# Patient Record
Sex: Male | Born: 1970 | Race: White | Hispanic: No | Marital: Married | Smoking: Never smoker
Health system: Southern US, Community
[De-identification: ages and names within clinical notes are randomized; demographics above are authoritative.]

## PROBLEM LIST (undated history)

## (undated) DIAGNOSIS — E079 Disorder of thyroid, unspecified: Secondary | ICD-10-CM

## (undated) DIAGNOSIS — B353 Tinea pedis: Secondary | ICD-10-CM

## (undated) DIAGNOSIS — I10 Essential (primary) hypertension: Secondary | ICD-10-CM

## (undated) DIAGNOSIS — J40 Bronchitis, not specified as acute or chronic: Secondary | ICD-10-CM

## (undated) DIAGNOSIS — E119 Type 2 diabetes mellitus without complications: Secondary | ICD-10-CM

## (undated) HISTORY — DX: Bronchitis, not specified as acute or chronic: J40

## (undated) HISTORY — DX: Tinea pedis: B35.3

## (undated) HISTORY — PX: CHOLECYSTECTOMY: SHX55

## (undated) HISTORY — DX: Type 2 diabetes mellitus without complications: E11.9

## (undated) HISTORY — PX: KIDNEY STONE SURGERY: SHX686

---

## 2011-02-11 ENCOUNTER — Inpatient Hospital Stay: Payer: Self-pay | Admitting: Internal Medicine

## 2011-02-11 ENCOUNTER — Ambulatory Visit: Payer: Self-pay | Admitting: Internal Medicine

## 2011-02-21 ENCOUNTER — Ambulatory Visit: Payer: Self-pay | Admitting: Surgery

## 2011-02-28 ENCOUNTER — Ambulatory Visit: Payer: Self-pay | Admitting: Surgery

## 2011-03-04 LAB — PATHOLOGY REPORT

## 2012-12-31 DIAGNOSIS — I10 Essential (primary) hypertension: Secondary | ICD-10-CM | POA: Insufficient documentation

## 2012-12-31 DIAGNOSIS — E669 Obesity, unspecified: Secondary | ICD-10-CM | POA: Insufficient documentation

## 2013-02-27 IMAGING — CT CT ABDOMEN AND PELVIS WITHOUT AND WITH CONTRAST
1 of 3 series · 12 of 32 positions shown, 18 images · IV contrast (isovue)
Comparison: None

REASON FOR EXAM: Rt Back Flank Pain Hematuria Elevated LFT
COMMENTS:

PROCEDURE:     SALMANOV - SALMANOV ABDOMEN / PELVIS W/WO  - February 11, 2011  [DATE]
RESULT:     History: Right flank pain
TECHNIQUE: Multiple axial images of the abdomen and pelvis were performed
from the lung bases to the pubic symphysis, with p.o. contrast and prior to
and following 100 ml of Isovue 370 intravenous contrast.

[Series 2: soft tissue · axial · 0.84mm/px · z∈[-94,+316]mm · 12 of 98 slices shown, 18 images]
[im 8/98  soft-tissue]
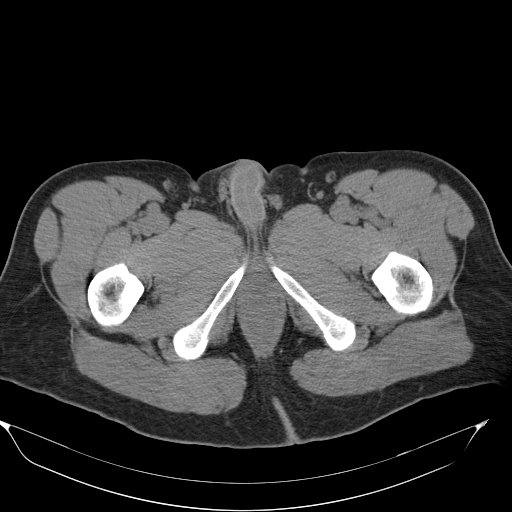
[im 8/98  bone]
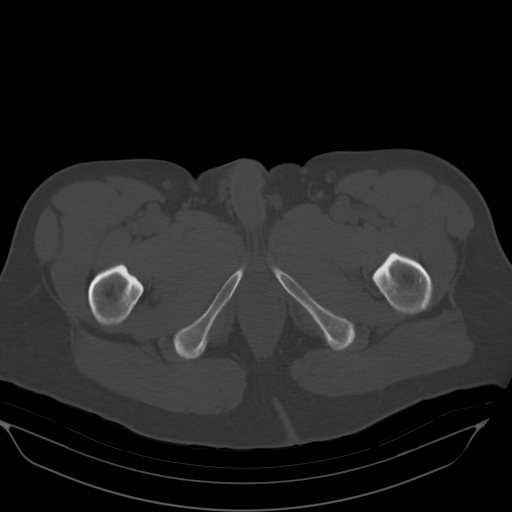
[im 15/98  soft-tissue]
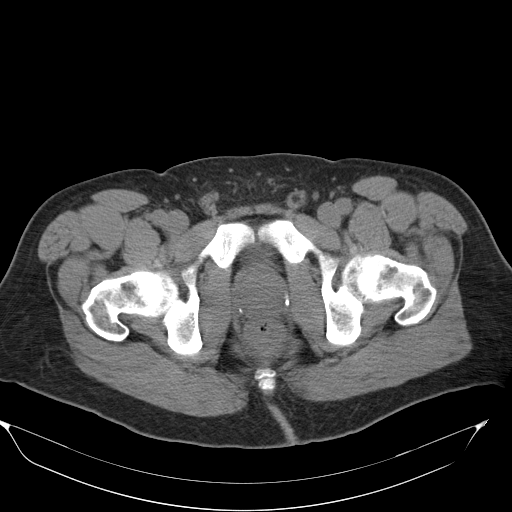
[im 23/98  soft-tissue]
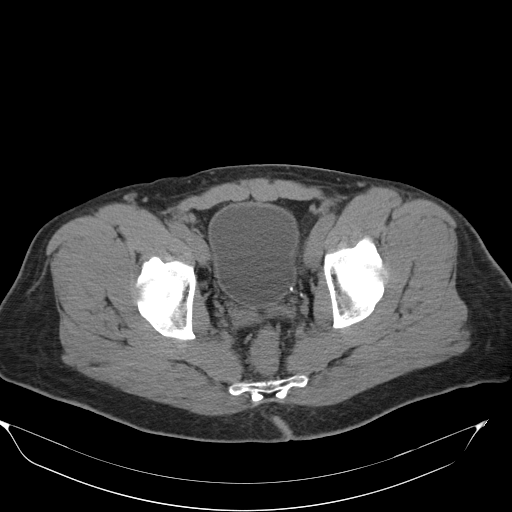
[im 30/98  soft-tissue]
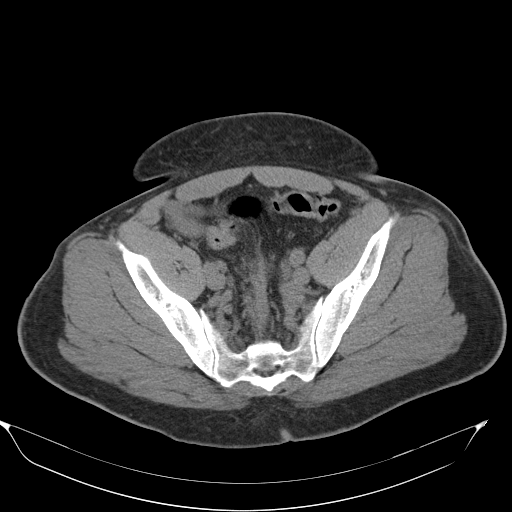
[im 38/98  soft-tissue]
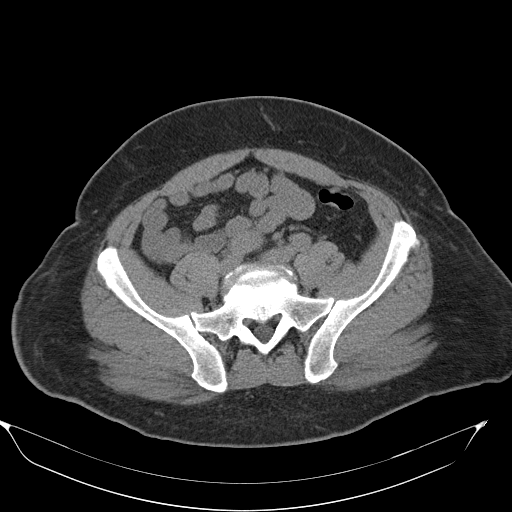
[im 45/98  soft-tissue]
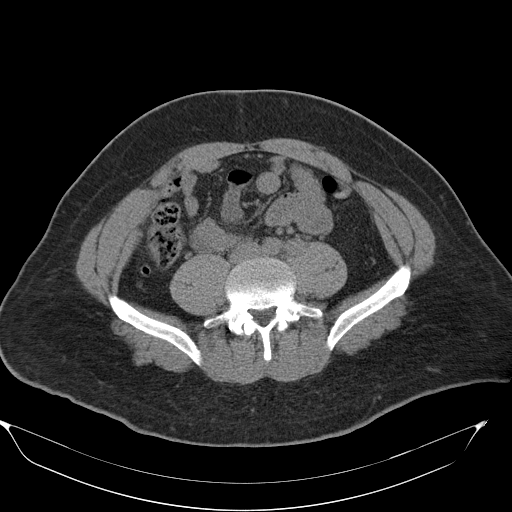
[im 53/98  soft-tissue]
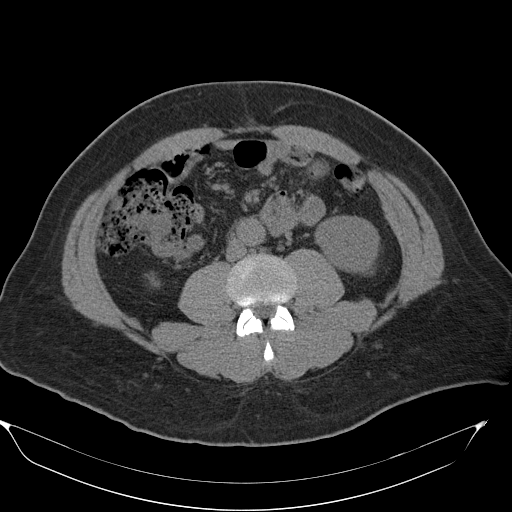
[im 60/98  soft-tissue]
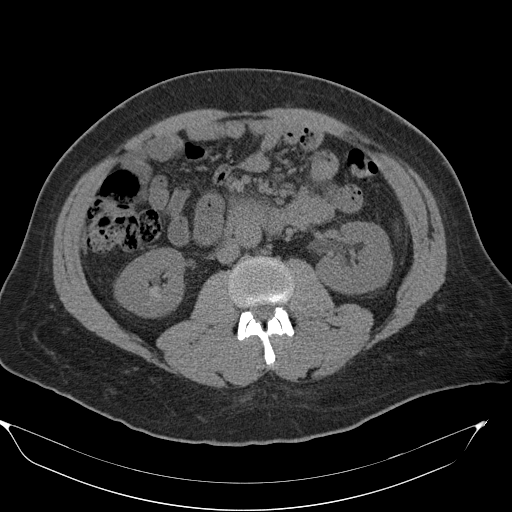
[im 68/98  soft-tissue]
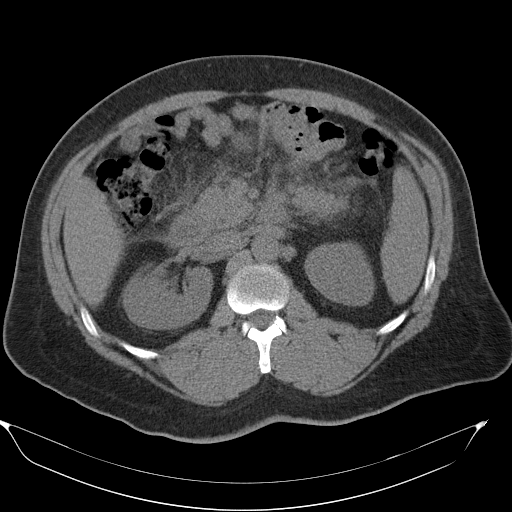
[im 68/98  lung]
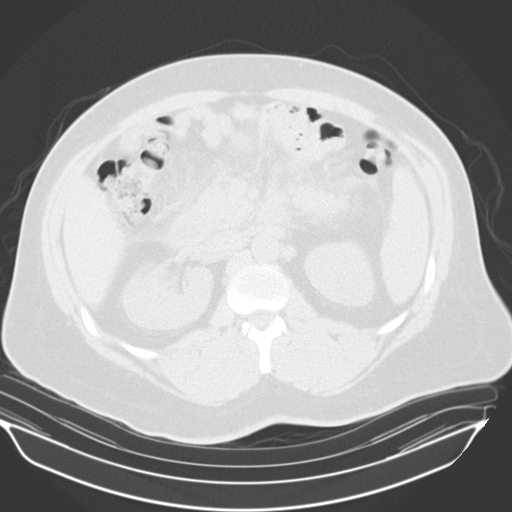
[im 68/98  bone]
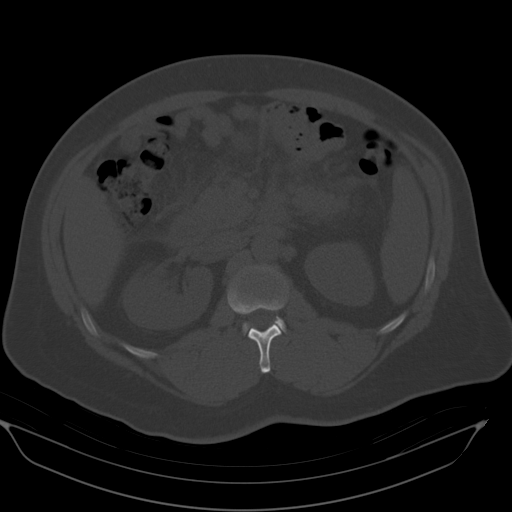
[im 75/98  soft-tissue]
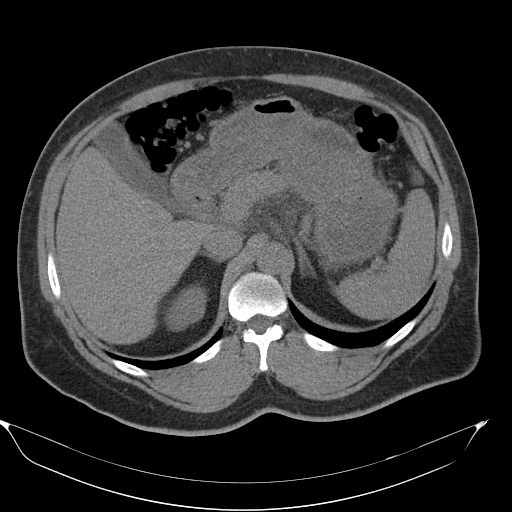
[im 75/98  lung]
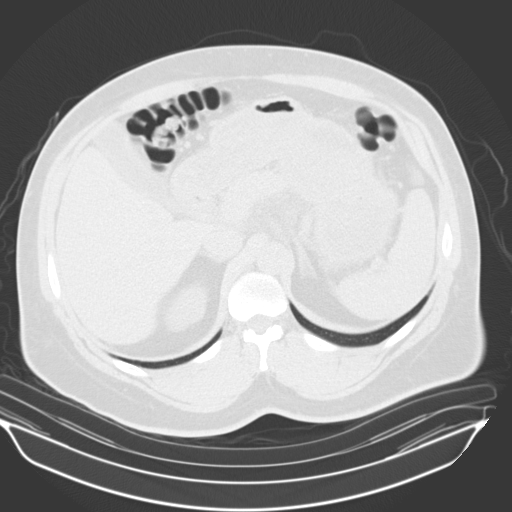
[im 83/98  soft-tissue]
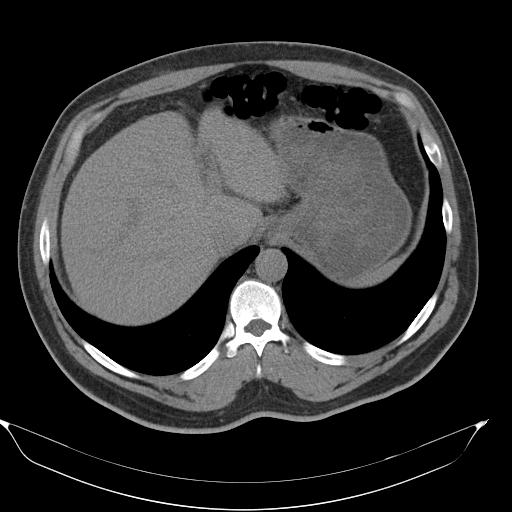
[im 83/98  lung]
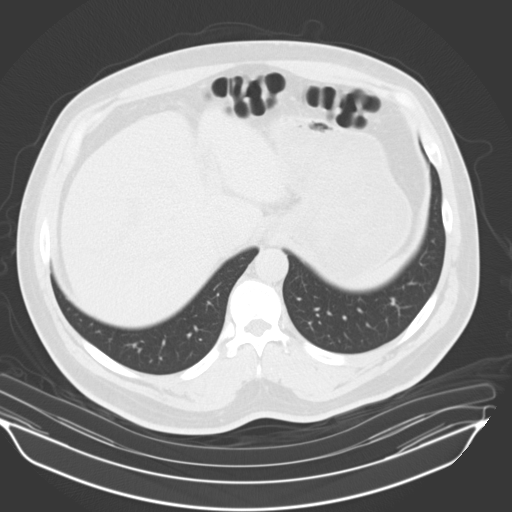
[im 90/98  soft-tissue]
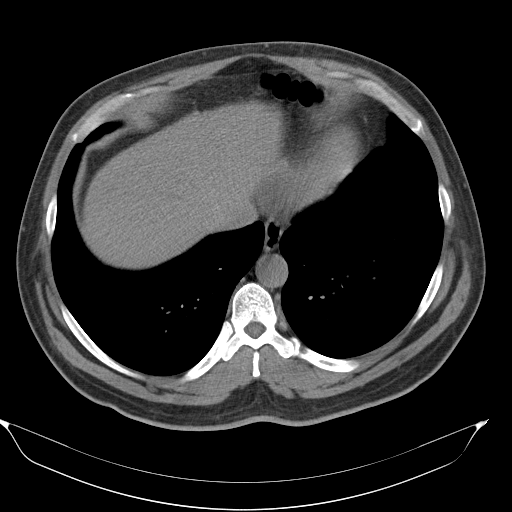
[im 90/98  lung]
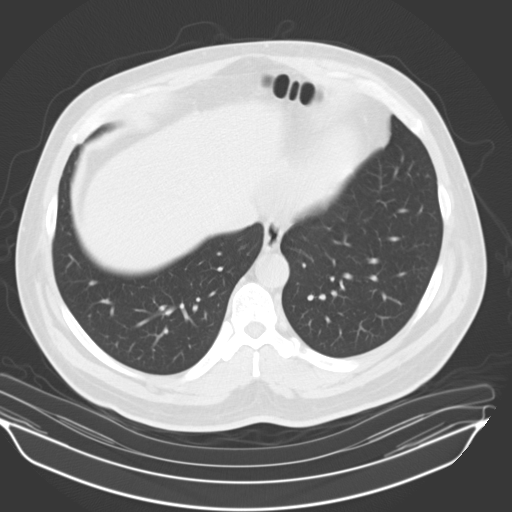

[12 of 32 positions shown; findings below may reference images not displayed]

FINDINGS: The lung bases are clear. There is no pneumothorax. The heart size is
normal.

The liver demonstrates no focal abnormality. There is no intrahepatic or
extrahepatic biliary ductal dilatation. The gallbladder is unremarkable. The
spleen demonstrates no focal abnormality. The kidneys and adrenal glands are
unremarkable. There is peripancreatic haziness around the pancreatic head
and neck as can be seen with pancreatitis. The bladder is unremarkable.

The stomach, duodenum, small intestine, and large intestine demonstrate no
contrast extravasation or dilatation. There is a normal caliber appendix in
the right lower quadrant without periappendiceal inflammatory changes. There
is no pneumoperitoneum, pneumatosis, or portal venous gas. There is no
abdominal or pelvic free fluid. There is no lymphadenopathy.

The abdominal aorta is normal in caliber .

The osseous structures are unremarkable.
IMPRESSION: 1. There is peripancreatic haziness around the pancreatic head and neck as
can be seen with pancreatitis. Early with lipase levels.

2. Normal appendix.

3. No urolithiasis.

## 2014-06-05 DIAGNOSIS — S60229A Contusion of unspecified hand, initial encounter: Secondary | ICD-10-CM | POA: Insufficient documentation

## 2015-08-22 DIAGNOSIS — E291 Testicular hypofunction: Secondary | ICD-10-CM | POA: Insufficient documentation

## 2015-08-22 DIAGNOSIS — E349 Endocrine disorder, unspecified: Secondary | ICD-10-CM | POA: Insufficient documentation

## 2015-09-14 DIAGNOSIS — E291 Testicular hypofunction: Secondary | ICD-10-CM | POA: Insufficient documentation

## 2015-12-20 ENCOUNTER — Ambulatory Visit
Admission: EM | Admit: 2015-12-20 | Discharge: 2015-12-20 | Disposition: A | Discharge status: Home / Self Care | Payer: Managed Care, Other (non HMO) | Attending: Family Medicine | Admitting: Family Medicine

## 2015-12-20 DIAGNOSIS — L03818 Cellulitis of other sites: Secondary | ICD-10-CM

## 2015-12-20 DIAGNOSIS — B49 Unspecified mycosis: Secondary | ICD-10-CM

## 2015-12-20 HISTORY — DX: Disorder of thyroid, unspecified: E07.9

## 2015-12-20 HISTORY — DX: Essential (primary) hypertension: I10

## 2015-12-20 MED ORDER — FLUCONAZOLE 150 MG PO TABS: 150.0000 mg | ORAL_TABLET | ORAL | Status: AC

## 2015-12-20 MED ORDER — SULFAMETHOXAZOLE-TRIMETHOPRIM 800-160 MG PO TABS: 1.0000 | ORAL_TABLET | Freq: Two times a day (BID) | ORAL | Status: AC

## 2015-12-20 NOTE — ED Notes (Signed)
Non-toxic appearing pt with reports of recurring "fungus" to toe.  Is being treated for "athletes foot" to abdominal fold.

## 2015-12-20 NOTE — ED Provider Notes (Signed)
CSN: [id number]     Arrival date & time 12/20/15  1437 History   First MD Initiated Contact with Patient 12/20/15 1553     Chief Complaint  Patient presents with  . Cellulitis  . Nail Problem  . Recurrent Skin Infections   (Consider location/radiation/quality/duration/timing/severity/associated sxs/prior Treatment) HPI: Patient presents today with symptoms of ongoing tinea pedis. Patient states that he has been treated with topical medicines in the past and oral medicines. He has noticed recently that there has been a oozing red site from the left webspace between the third and fourth and fourth and fifth toes. He also states that he has had a fungal infection in the pannus area and jock itch area that has improved some with the Diflucan that was prescribed for him. He was only given 2 doses of this to be spread out over 3 days. He denies any fever. He is seeing an endocrinologist tomorrow and we will check for any conditions such as diabetes. He has checked his blood sugar in the recent past and was normal according to patient.  Past Medical History  Diagnosis Date  . Thyroid disease   . Hypertension    Past Surgical History  Procedure Laterality Date  . Cholecystectomy     History reviewed. No pertinent family history. Social History  Substance Use Topics  . Smoking status: Never Smoker   . Smokeless tobacco: None  . Alcohol Use: No    Review of Systems: Negative except mentioned.   Allergies  Review of patient's allergies indicates no known allergies.  Home Medications   Prior to Admission medications   Medication Sig Start Date End Date Taking? Authorizing Provider  lisinopril (PRINIVIL,ZESTRIL) 20 MG tablet Take 20 mg by mouth daily.   Yes Historical Provider, MD  testosterone cypionate (DEPOTESTOSTERONE CYPIONATE) 200 MG/ML injection Inject 0.75 mg into the muscle every 7 (seven) days.   Yes Historical Provider, MD  fluconazole (DIFLUCAN) 150 MG tablet Take 1 tablet (150  mg total) by mouth once a week. 12/20/15 01/10/16  Jolene Provost, MD  sulfamethoxazole-trimethoprim (BACTRIM DS,SEPTRA DS) 800-160 MG tablet Take 1 tablet by mouth 2 (two) times daily. 12/20/15 12/27/15  Jolene Provost, MD   Meds Ordered and Administered this Visit  Medications - No data to display  BP 150/100 mmHg  Pulse 85  Temp(Src) 98 F (36.7 C) (Oral)  Resp 16  SpO2 95% No data found.   Physical Exam:  GENERAL: NAD, obese  RESP: CTA B CARD: RRR SKIN: mild erythema of pannus area, no breakdown of skin or drainage, *didn't show genital area but states it is cleared EXTREM: L Foot: mild erythema between the third and fourth toes and fourth and fifth toes, there is a small draining area between the fourth and fifth toes, there is no erythema extending into the dorsum of the foot at this time or on the plantar surface, mild onychomycoses  NEURO: CN II-XII grossly intact   ED Course  Procedures (including critical care time)  Labs Review Labs Reviewed - No data to display  Imaging Review No results found.    MDM   1. Cellulitis of other specified site   2. Fungal infection    Will treat with oral Diflucan weekly for 4 weeks given large surface area, Bactrim DS given forcellulitic process developing on the foot, encourage patient to keep the area dry and clean and to monitor site daily. He will be checking with the endocrinologist tomorrow to review any risk for diabetes.  I've asked that he follow up with podiatry as well. Seek medical attention if symptoms persist or worsen as discussed.    Jolene Provost, MD 12/20/15 [phone]

## 2015-12-20 NOTE — ED Notes (Signed)
Reporting toe infection.  Swelling to toe.   Has fungus/yeast infection to panis and foot.  Has been putting nyastatin on area, has some left, but is running out.

## 2016-02-08 ENCOUNTER — Encounter: Payer: Self-pay | Admitting: Family Medicine

## 2016-02-08 ENCOUNTER — Ambulatory Visit (INDEPENDENT_AMBULATORY_CARE_PROVIDER_SITE_OTHER): Payer: Managed Care, Other (non HMO) | Admitting: Family Medicine

## 2016-02-08 VITALS — BP 148/102 | HR 76 | Temp 98.0°F | Resp 16 | Ht 70.5 in | Wt 368.0 lb

## 2016-02-08 DIAGNOSIS — E349 Endocrine disorder, unspecified: Secondary | ICD-10-CM

## 2016-02-08 DIAGNOSIS — R7303 Prediabetes: Secondary | ICD-10-CM | POA: Insufficient documentation

## 2016-02-08 DIAGNOSIS — I10 Essential (primary) hypertension: Secondary | ICD-10-CM

## 2016-02-08 DIAGNOSIS — E291 Testicular hypofunction: Secondary | ICD-10-CM

## 2016-02-08 DIAGNOSIS — Z87442 Personal history of urinary calculi: Secondary | ICD-10-CM | POA: Insufficient documentation

## 2016-02-08 MED ORDER — METFORMIN HCL 500 MG PO TABS
500.0000 mg | ORAL_TABLET | Freq: Two times a day (BID) | ORAL | Status: DC
Start: 2016-02-08 — End: 2016-03-05

## 2016-02-08 MED ORDER — HYDROCHLOROTHIAZIDE 25 MG PO TABS: 25.0000 mg | ORAL_TABLET | Freq: Every day | ORAL | Status: DC

## 2016-02-08 NOTE — Progress Notes (Signed)
Date:  02/08/2016   Name:  Wesley Hall   DOB:  09-Jul-1971   MRN:  [id number]  PCP:  Rolm Gala, MD    Chief Complaint: Establish Care   History of Present Illness:  This is a 45 y.o. male to establish care. On testosterone injections through [facility] endo x 5 months but having multiple side effects including fluid retention/edema, acne, sweating. Does not feel these side effects are being adequately addressed and thinks maybe should be on antiestrogen and HGH. Not interested in stopping testosterone but willing to see another endo. Last test level high and endo wanted to lower dose. HTN well controlled on lisinopril before injections but poorly controlled since. BMI 52, injections have not given energy to exercise but is lifting weights again. Hx recurrent fungal infections of feet and groin. Prediabetic in past, a1c 5.6% in October. PSA and TSH normal in October. Thinks needs sleep study due to snoring but thinks can arrange without copay (works in sleep lab). Mother bipolar, only child. Last tet imm 2008.  Review of Systems:  Review of Systems  Constitutional: Negative for fever and fatigue.  Respiratory: Negative for cough and shortness of breath.   Cardiovascular: Negative for chest pain.  Gastrointestinal: Negative for abdominal pain.  Endocrine: Negative for polyuria.  Genitourinary: Negative for difficulty urinating.  Neurological: Negative for syncope and light-headedness.    Patient Active Problem List   Diagnosis Date Noted  . History of kidney stones 02/08/2016  . Obesity, morbid, BMI 50 or higher (HCC) 02/08/2016  . Prediabetes 02/08/2016  . Hypogonadism in male 08/22/2015  . BP (high blood pressure) 12/31/2012    Prior to Admission medications   Medication Sig Start Date End Date Taking? Authorizing Provider  B-D 3CC LUER-LOK SYR 18GX1-1/2 18G X 1-1/2" 3 ML MISC USE TO DRAW UP TESTOSTERONE FROM THE VIAL. 12/24/15  Yes Historical Provider, MD  BD DISP NEEDLES  22G X 1-1/2" MISC USE TO ADMINISTER IM TESTOSTERONE. 11/23/15  Yes Historical Provider, MD  lisinopril (PRINIVIL,ZESTRIL) 20 MG tablet Take 20 mg by mouth daily.   Yes Historical Provider, MD  nystatin cream (MYCOSTATIN) Apply topically. 11/19/15 11/18/16 Yes Historical Provider, MD  testosterone cypionate (DEPOTESTOSTERONE CYPIONATE) 200 MG/ML injection Inject 0.75 mg into the muscle every 7 (seven) days.   Yes Historical Provider, MD  triamcinolone cream (KENALOG) 0.1 % APPLY TOPICALLY 3 (THREE) TIMES DAILY. 11/19/15  Yes Historical Provider, MD  hydrochlorothiazide (HYDRODIURIL) 25 MG tablet Take 1 tablet (25 mg total) by mouth daily. 02/08/16   Schuyler Amor, MD  metFORMIN (GLUCOPHAGE) 500 MG tablet Take 1 tablet (500 mg total) by mouth 2 (two) times daily with a meal. 02/08/16   Schuyler Amor, MD    No Known Allergies  Past Surgical History  Procedure Laterality Date  . Cholecystectomy      Social History  Substance Use Topics  . Smoking status: Never Smoker   . Smokeless tobacco: None  . Alcohol Use: No    History reviewed. No pertinent family history.  Medication list has been reviewed and updated.  Physical Examination: BP 148/102 mmHg  Pulse 76  Temp(Src) 98 F (36.7 C) (Oral)  Resp 16  Ht 5' 10.5" (1.791 m)  Wt 368 lb (166.924 kg)  BMI 52.04 kg/m2  SpO2 96%  Physical Exam  Constitutional: He is oriented to person, place, and time. He appears well-developed and well-nourished.  HENT:  Head: Normocephalic and atraumatic.  Right Ear: External ear normal.  Left Ear: External ear  normal.  Nose: Nose normal.  Mouth/Throat: Oropharynx is clear and moist.  Eyes: Conjunctivae and EOM are normal. Pupils are equal, round, and reactive to light.  Neck: Neck supple. No thyromegaly present.  Cardiovascular: Normal rate, regular rhythm and normal heart sounds.   Pulmonary/Chest: Effort normal and breath sounds normal.  Abdominal: Soft. He exhibits no distension and no mass.   Genitourinary: Penis normal.  Testes small, no masses, no hernias noted  Musculoskeletal:  !+ BLE edema  Lymphadenopathy:    He has no cervical adenopathy.  Neurological: He is alert and oriented to person, place, and time. Coordination normal.  Skin: Skin is warm and dry.  Psychiatric: He has a normal mood and affect. His behavior is normal.  Nursing note and vitals reviewed.   Assessment and Plan:  1. Essential hypertension Poor control, add HCTZ 25 mg daily to lisinopril  2. Prediabetes Begin metformin 500 mg bid, may help with weight loss  3. Hypotestosteronism Cont testosterone replacement at current dose until sees new endo - Ambulatory referral to Endocrinology  4. Obesity, morbid, BMI 50 or higher (HCC) Discussed exercise/weight loss  Return in about 4 weeks (around 03/07/2016).  [name] Kingsley Hall, [name] [facility]  02/08/2016

## 2016-03-05 ENCOUNTER — Ambulatory Visit (INDEPENDENT_AMBULATORY_CARE_PROVIDER_SITE_OTHER): Payer: Managed Care, Other (non HMO) | Admitting: Family Medicine

## 2016-03-05 ENCOUNTER — Encounter: Payer: Self-pay | Admitting: Family Medicine

## 2016-03-05 VITALS — BP 110/78 | HR 96 | Resp 16 | Ht 71.0 in | Wt 360.0 lb

## 2016-03-05 DIAGNOSIS — I10 Essential (primary) hypertension: Secondary | ICD-10-CM

## 2016-03-05 DIAGNOSIS — E291 Testicular hypofunction: Secondary | ICD-10-CM | POA: Diagnosis not present

## 2016-03-05 DIAGNOSIS — R7303 Prediabetes: Secondary | ICD-10-CM | POA: Diagnosis not present

## 2016-03-05 DIAGNOSIS — S0031XA Abrasion of nose, initial encounter: Secondary | ICD-10-CM

## 2016-03-05 MED ORDER — METFORMIN HCL 500 MG PO TABS: 500.0000 mg | ORAL_TABLET | Freq: Two times a day (BID) | ORAL | Status: DC

## 2016-03-05 MED ORDER — HYDROCHLOROTHIAZIDE 25 MG PO TABS: 25.0000 mg | ORAL_TABLET | Freq: Every day | ORAL | Status: DC

## 2016-03-05 NOTE — Progress Notes (Signed)
Date:  03/05/2016   Name:  Wesley Hall   DOB:  [birth year]   MRN:  [id number]  PCP:  Rolm Gala, MD    Chief Complaint: Hypertension and Muscle Pain   History of Present Illness:  This is a 45 y.o. male seen in one month f/u. Tolerating HCTZ and metformin well. Weight down 8#. Saw [facility] endo, testosterone dose decreased with f/u in two months. C/o R nose lesion, used nasal spray recently.  Review of Systems:  Review of Systems  Constitutional: Negative for fever and fatigue.  Respiratory: Negative for cough and shortness of breath.   Endocrine: Negative for polyuria.  Genitourinary: Negative for difficulty urinating.  Neurological: Negative for syncope and light-headedness.    Patient Active Problem List   Diagnosis Date Noted  . History of kidney stones 02/08/2016  . Obesity, morbid, BMI 50 or higher (HCC) 02/08/2016  . Prediabetes 02/08/2016  . Hypogonadism in male 08/22/2015  . Hypertension 12/31/2012    Prior to Admission medications   Medication Sig Start Date End Date Taking? Authorizing Provider  albuterol (PROAIR HFA) 108 (90 Base) MCG/ACT inhaler  12/26/15  Yes Historical Provider, MD  B-D 3CC LUER-LOK SYR 18GX1-1/2 18G X 1-1/2" 3 ML MISC USE TO DRAW UP TESTOSTERONE FROM THE VIAL. 12/24/15  Yes Historical Provider, MD  BD DISP NEEDLES 22G X 1-1/2" MISC USE TO ADMINISTER IM TESTOSTERONE. 11/23/15  Yes Historical Provider, MD  hydrochlorothiazide (HYDRODIURIL) 25 MG tablet Take 1 tablet (25 mg total) by mouth daily. 03/05/16  Yes Schuyler Amor, MD  lisinopril (PRINIVIL,ZESTRIL) 20 MG tablet Take 20 mg by mouth daily.   Yes Historical Provider, MD  metFORMIN (GLUCOPHAGE) 500 MG tablet Take 1 tablet (500 mg total) by mouth 2 (two) times daily with a meal. 03/05/16  Yes Schuyler Amor, MD  testosterone cypionate (DEPOTESTOSTERONE CYPIONATE) 200 MG/ML injection Inject 0.75 mg into the muscle every 7 (seven) days.   Yes Historical Provider, MD  triamcinolone cream (KENALOG)  0.1 % APPLY TOPICALLY 3 (THREE) TIMES DAILY. 11/19/15  Yes Historical Provider, MD    No Known Allergies  Past Surgical History  Procedure Laterality Date  . Cholecystectomy      Social History  Substance Use Topics  . Smoking status: Never Smoker   . Smokeless tobacco: None  . Alcohol Use: No    History reviewed. No pertinent family history.  Medication list has been reviewed and updated.  Physical Examination: BP 110/78 mmHg  Pulse 96  Resp 16  Ht  (1.803 m)  Wt 360 lb (163.295 kg)  BMI 50.23 kg/m2  SpO2 96%  Physical Exam  Constitutional: He appears well-developed and well-nourished.  HENT:  Healing abrasion R nares  Cardiovascular: Normal rate, regular rhythm and normal heart sounds.   Pulmonary/Chest: Effort normal and breath sounds normal.  Musculoskeletal: He exhibits no edema.  Neurological: He is alert.  Skin: Skin is warm and dry.  Psychiatric: He has a normal mood and affect. His behavior is normal.  Nursing note and vitals reviewed.   Assessment and Plan:  1. Essential hypertension Well controlled on lisinopril/HCTZ, prefer to keep rxs separate, refill HCTZ  2. Prediabetes Refill metformin, consider a1c next visit (5.6% in October)  3. Obesity, morbid, BMI 50 or higher (HCC) Weight down 8# (metformin may be helping), encouraged continued weight loss  4. Hypogonadism in male On decreased testosterone dose, [facility] endo following  5. Abrasion of nose, initial encounter Expect spontaneous resolution  Return in about 3 months (  around 06/04/2016).  [name] [name], [name] [facility]  03/05/2016

## 2016-03-24 ENCOUNTER — Ambulatory Visit: Payer: Self-pay | Admitting: Unknown Physician Specialty

## 2016-04-03 ENCOUNTER — Other Ambulatory Visit: Payer: Self-pay | Admitting: Family Medicine

## 2016-04-03 MED ORDER — HYDROCHLOROTHIAZIDE 25 MG PO TABS: 25.0000 mg | ORAL_TABLET | Freq: Every day | ORAL | Status: DC

## 2016-04-04 ENCOUNTER — Other Ambulatory Visit: Payer: Self-pay | Admitting: Family Medicine

## 2016-04-04 MED ORDER — METFORMIN HCL 500 MG PO TABS: 500.0000 mg | ORAL_TABLET | Freq: Two times a day (BID) | ORAL | Status: DC

## 2016-04-20 ENCOUNTER — Ambulatory Visit
Admission: EM | Admit: 2016-04-20 | Discharge: 2016-04-20 | Disposition: A | Discharge status: Home / Self Care | Payer: Managed Care, Other (non HMO) | Attending: Family Medicine | Admitting: Family Medicine

## 2016-04-20 DIAGNOSIS — S0085XA Superficial foreign body of other part of head, initial encounter: Secondary | ICD-10-CM

## 2016-04-20 MED ORDER — TETANUS-DIPHTH-ACELL PERTUSSIS 5-2.5-18.5 LF-MCG/0.5 IM SUSP
0.5000 mL | Freq: Once | INTRAMUSCULAR | Status: AC
  Administered 2016-04-20: 0.5 mL via INTRAMUSCULAR

## 2016-04-20 MED ORDER — NYSTATIN 100000 UNIT/GM EX POWD: Freq: Three times a day (TID) | CUTANEOUS | Status: AC

## 2016-04-20 MED ORDER — LIDOCAINE HCL (PF) 1 % IJ SOLN: 5.0000 mL | Freq: Once | INTRAMUSCULAR | Status: DC

## 2016-04-20 MED ORDER — CEPHALEXIN 500 MG PO CAPS: 500.0000 mg | ORAL_CAPSULE | Freq: Four times a day (QID) | ORAL | Status: DC

## 2016-04-20 NOTE — Discharge Instructions (Signed)
Take medication as prescribed. Rest. Drink plenty of fluids. Keep area clean and dry.   Return to Urgent care or PCP in 7 days for suture removal as discussed.   Follow up with your primary care physician this week as needed. Return to Urgent care for new or worsening concerns.
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
## 2016-06-04 ENCOUNTER — Ambulatory Visit: Payer: Managed Care, Other (non HMO) | Admitting: Family Medicine

## 2016-06-04 ENCOUNTER — Encounter: Payer: Self-pay | Admitting: Family Medicine

## 2016-06-04 ENCOUNTER — Ambulatory Visit (INDEPENDENT_AMBULATORY_CARE_PROVIDER_SITE_OTHER): Payer: Managed Care, Other (non HMO) | Admitting: Family Medicine

## 2016-06-04 ENCOUNTER — Encounter (INDEPENDENT_AMBULATORY_CARE_PROVIDER_SITE_OTHER): Payer: Self-pay

## 2016-06-04 VITALS — BP 130/80 | HR 78 | Ht 71.0 in | Wt 346.0 lb

## 2016-06-04 DIAGNOSIS — E8881 Metabolic syndrome: Secondary | ICD-10-CM | POA: Diagnosis not present

## 2016-06-04 DIAGNOSIS — E781 Pure hyperglyceridemia: Secondary | ICD-10-CM | POA: Diagnosis not present

## 2016-06-04 DIAGNOSIS — B354 Tinea corporis: Secondary | ICD-10-CM | POA: Diagnosis not present

## 2016-06-04 DIAGNOSIS — I10 Essential (primary) hypertension: Secondary | ICD-10-CM | POA: Diagnosis not present

## 2016-06-04 DIAGNOSIS — Z7189 Other specified counseling: Secondary | ICD-10-CM

## 2016-06-04 DIAGNOSIS — Z7689 Persons encountering health services in other specified circumstances: Secondary | ICD-10-CM

## 2016-06-04 MED ORDER — METFORMIN HCL 500 MG PO TABS: 500.0000 mg | ORAL_TABLET | Freq: Two times a day (BID) | ORAL | 1 refills | Status: DC

## 2016-06-04 MED ORDER — HYDROCHLOROTHIAZIDE 25 MG PO TABS: 25.0000 mg | ORAL_TABLET | Freq: Every day | ORAL | 1 refills | Status: DC

## 2016-06-04 MED ORDER — LISINOPRIL 20 MG PO TABS: 20.0000 mg | ORAL_TABLET | Freq: Every day | ORAL | 1 refills | Status: DC

## 2016-06-04 MED ORDER — CLOTRIMAZOLE-BETAMETHASONE 1-0.05 % EX CREA: 1.0000 "application " | TOPICAL_CREAM | Freq: Two times a day (BID) | CUTANEOUS | 2 refills | Status: DC

## 2016-06-04 NOTE — Progress Notes (Signed)
Name: Wesley Hall   MRN: [id number]    DOB: 12/09/1970   Date:06/04/2016       Progress Note  Subjective  Chief Complaint  Chief Complaint  Patient presents with  . Advice Only    Diabetes  He presents for his follow-up diabetic visit. Diabetes type: prediabetic/metabolic syndrome. His disease course has been stable. There are no hypoglycemic associated symptoms. Associated symptoms include fatigue and weight loss. Pertinent negatives for diabetes include no blurred vision, no chest pain, no foot paresthesias, no foot ulcerations, no polydipsia, no polyphagia, no polyuria, no visual change and no weakness. (By design) Symptoms are stable. There are no diabetic complications. Risk factors for coronary artery disease include obesity, male sex and hypertension. Current diabetic treatment includes oral agent (monotherapy) (metfomen). He is compliant with treatment all of the time. His weight is stable. He is following a generally healthy diet. He participates in exercise daily.    No problem-specific Assessment & Plan notes found for this encounter.   Past Medical History:  Diagnosis Date  . Athlete's foot   . Bronchitis   . Hypertension   . Thyroid disease     Past Surgical History:  Procedure Laterality Date  . CHOLECYSTECTOMY      No family history on file.  Social History   Social History  . Marital status: Married    Spouse name: N/A  . Number of children: N/A  . Years of education: N/A   Occupational History  . Not on file.   Social History Main Topics  . Smoking status: Never Smoker  . Smokeless tobacco: Never Used  . Alcohol use No  . Drug use: No  . Sexual activity: Yes   Other Topics Concern  . Not on file   Social History Narrative  . No narrative on file    No Known Allergies   Review of Systems  Constitutional: Positive for fatigue and weight loss.  Eyes: Negative for blurred vision.  Cardiovascular: Negative for chest pain.  Neurological:  Negative for weakness.  Endo/Heme/Allergies: Negative for polydipsia and polyphagia.     Objective  Vitals:   06/04/16 0858  BP: 130/80  Pulse: 78  Weight: (!) 346 lb (156.9 kg)  Height: 5\' 11"  (1.803 m)    Physical Exam  Constitutional: He is oriented to person, place, and time and well-developed, well-nourished, and in no distress.  HENT:  Head: Normocephalic.  Right Ear: External ear normal.  Left Ear: External ear normal.  Nose: Nose normal.  Mouth/Throat: Oropharynx is clear and moist.  Eyes: Conjunctivae and EOM are normal. Pupils are equal, round, and reactive to light. Right eye exhibits no discharge. Left eye exhibits no discharge. No scleral icterus.  Neck: Normal range of motion. Neck supple. No JVD present. No tracheal deviation present. No thyromegaly present.  Cardiovascular: Normal rate, regular rhythm, normal heart sounds and intact distal pulses.  Exam reveals no gallop and no friction rub.   No murmur heard. Pulmonary/Chest: Breath sounds normal. No respiratory distress. He has no wheezes. He has no rales.  Abdominal: Soft. Bowel sounds are normal. He exhibits no mass. There is no hepatosplenomegaly. There is no tenderness. There is no rebound, no guarding and no CVA tenderness.  Musculoskeletal: Normal range of motion. He exhibits no edema or tenderness.  Lymphadenopathy:    He has no cervical adenopathy.  Neurological: He is alert and oriented to person, place, and time. He has normal sensation, normal strength and intact cranial nerves. No  cranial nerve deficit.  Skin: Skin is warm. No rash noted.  Psychiatric: Mood and affect normal.  Nursing note and vitals reviewed.     Assessment & Plan  Problem List Items Addressed This Visit      Cardiovascular and Mediastinum   Hypertension - Primary   Relevant Medications   hydrochlorothiazide (HYDRODIURIL) 25 MG tablet   lisinopril (PRINIVIL,ZESTRIL) 20 MG tablet     Other   Obesity, morbid, BMI 50 or  higher (HCC)   Relevant Medications   metFORMIN (GLUCOPHAGE) 500 MG tablet    Other Visit Diagnoses    Hypertriglyceridemia       Relevant Medications   hydrochlorothiazide (HYDRODIURIL) 25 MG tablet   lisinopril (PRINIVIL,ZESTRIL) 20 MG tablet   Metabolic syndrome       Relevant Medications   metFORMIN (GLUCOPHAGE) 500 MG tablet   Tinea corporis       Relevant Medications   clotrimazole-betamethasone (LOTRISONE) cream   Encounter to establish care            Dr. Hayden [facility]  06/04/16

## 2016-12-05 ENCOUNTER — Ambulatory Visit (INDEPENDENT_AMBULATORY_CARE_PROVIDER_SITE_OTHER): Payer: Managed Care, Other (non HMO) | Admitting: Family Medicine

## 2016-12-05 ENCOUNTER — Encounter: Payer: Self-pay | Admitting: Family Medicine

## 2016-12-05 VITALS — BP 120/70 | HR 72 | Ht 71.0 in | Wt 320.0 lb

## 2016-12-05 DIAGNOSIS — I10 Essential (primary) hypertension: Secondary | ICD-10-CM | POA: Diagnosis not present

## 2016-12-05 DIAGNOSIS — B354 Tinea corporis: Secondary | ICD-10-CM

## 2016-12-05 DIAGNOSIS — E663 Overweight: Secondary | ICD-10-CM

## 2016-12-05 DIAGNOSIS — B372 Candidiasis of skin and nail: Secondary | ICD-10-CM | POA: Diagnosis not present

## 2016-12-05 MED ORDER — HYDROCHLOROTHIAZIDE 25 MG PO TABS: 25.0000 mg | ORAL_TABLET | Freq: Every day | ORAL | 2 refills | Status: DC

## 2016-12-05 MED ORDER — CLOTRIMAZOLE-BETAMETHASONE 1-0.05 % EX CREA: 1.0000 "application " | TOPICAL_CREAM | Freq: Two times a day (BID) | CUTANEOUS | 2 refills | Status: DC

## 2016-12-05 MED ORDER — LISINOPRIL 20 MG PO TABS: 20.0000 mg | ORAL_TABLET | Freq: Every day | ORAL | 2 refills | Status: DC

## 2016-12-05 NOTE — Progress Notes (Signed)
Name: Wesley Hall   MRN: [id number]    DOB: 1971-02-10   Date:12/05/2016       Progress Note  Subjective  Chief Complaint  Chief Complaint  Patient presents with  . Hypertension  . Rash    uses Lotrisome cream for treatment    Hypertension  This is a chronic problem. The current episode started more than 1 year ago. The problem has been gradually improving since onset. The problem is controlled. Pertinent negatives include no anxiety, blurred vision, chest pain, headaches, malaise/fatigue, neck pain, orthopnea, palpitations, peripheral edema, PND, shortness of breath or sweats. There are no associated agents to hypertension. There are no known risk factors for coronary artery disease. Past treatments include ACE inhibitors and diuretics. There are no compliance problems.  There is no history of angina, kidney disease, CAD/MI, CVA, heart failure, left ventricular hypertrophy, PVD, renovascular disease or retinopathy. There is no history of chronic renal disease or a hypertension causing med.  Rash  This is a chronic problem. The current episode started more than 1 year ago. The problem has been waxing and waning since onset. The affected locations include the left lower leg and right lower leg. Pertinent negatives include no anorexia, congestion, cough, diarrhea, eye pain, facial edema, fatigue, fever, joint pain, nail changes, rhinorrhea, shortness of breath, sore throat or vomiting. Past treatments include nothing. The treatment provided mild relief.    No problem-specific Assessment & Plan notes found for this encounter.   Past Medical History:  Diagnosis Date  . Athlete's foot   . Bronchitis   . Diabetes mellitus without complication (HCC)   . Hypertension   . Thyroid disease     Past Surgical History:  Procedure Laterality Date  . CHOLECYSTECTOMY      History reviewed. No pertinent family history.  Social History   Social History  . Marital status: Married    Spouse  name: N/A  . Number of children: N/A  . Years of education: N/A   Occupational History  . Not on file.   Social History Main Topics  . Smoking status: Never Smoker  . Smokeless tobacco: Never Used  . Alcohol use No  . Drug use: No  . Sexual activity: Yes   Other Topics Concern  . Not on file   Social History Narrative  . No narrative on file    No Known Allergies   Review of Systems  Constitutional: Negative for chills, fatigue, fever, malaise/fatigue and weight loss.  HENT: Negative for congestion, ear discharge, ear pain, rhinorrhea and sore throat.   Eyes: Negative for blurred vision and pain.  Respiratory: Negative for cough, sputum production, shortness of breath and wheezing.   Cardiovascular: Negative for chest pain, palpitations, orthopnea, leg swelling and PND.  Gastrointestinal: Negative for abdominal pain, anorexia, blood in stool, constipation, diarrhea, heartburn, melena, nausea and vomiting.  Genitourinary: Negative for dysuria, flank pain, frequency, hematuria and urgency.       Nocturia/no hesitancy nor weak stream  Musculoskeletal: Negative for back pain, joint pain, myalgias and neck pain.  Skin: Positive for rash. Negative for nail changes.  Neurological: Positive for dizziness. Negative for tingling, sensory change, focal weakness and headaches.  Endo/Heme/Allergies: Negative for environmental allergies and polydipsia. Does not bruise/bleed easily.  Psychiatric/Behavioral: Negative for depression and suicidal ideas. The patient is not nervous/anxious and does not have insomnia.      Objective  Vitals:   12/05/16 1346  BP: 120/70  Pulse: 72  Weight: (!) 320  lb (145.2 kg)  Height: 5\' 11"  (1.803 m)    Physical Exam  Constitutional: He is oriented to person, place, and time and well-developed, well-nourished, and in no distress.  HENT:  Head: Normocephalic.  Right Ear: External ear normal.  Left Ear: External ear normal.  Nose: Nose normal.   Mouth/Throat: Oropharynx is clear and moist.  Eyes: Conjunctivae and EOM are normal. Pupils are equal, round, and reactive to light. Right eye exhibits no discharge. Left eye exhibits no discharge. No scleral icterus.  Neck: Normal range of motion. Neck supple. No JVD present. No tracheal deviation present. No thyromegaly present.  Cardiovascular: Normal rate, regular rhythm, normal heart sounds and intact distal pulses.  Exam reveals no gallop and no friction rub.   No murmur heard. Pulmonary/Chest: Breath sounds normal. No respiratory distress. He has no wheezes. He has no rales.  Abdominal: Soft. Bowel sounds are normal. He exhibits no mass. There is no hepatosplenomegaly. There is no tenderness. There is no rebound, no guarding and no CVA tenderness.  Musculoskeletal: Normal range of motion. He exhibits no edema or tenderness.  Lymphadenopathy:    He has no cervical adenopathy.  Neurological: He is alert and oriented to person, place, and time. He has normal sensation, normal strength, normal reflexes and intact cranial nerves. No cranial nerve deficit.  Skin: Skin is warm. No rash noted.  Psychiatric: Mood and affect normal.  Nursing note and vitals reviewed.     Assessment & Plan  Problem List Items Addressed This Visit      Cardiovascular and Mediastinum   Hypertension - Primary   Relevant Medications   lisinopril (PRINIVIL,ZESTRIL) 20 MG tablet   hydrochlorothiazide (HYDRODIURIL) 25 MG tablet   Other Relevant Orders   Renal Function Panel    Other Visit Diagnoses    Candida infection of flexural skin       Relevant Medications   clotrimazole-betamethasone (LOTRISONE) cream   Tinea corporis       Relevant Medications   clotrimazole-betamethasone (LOTRISONE) cream   Overweight       Relevant Orders   Lipid Profile        Dr. Elizabeth Sauer [facility]  12/05/16

## 2016-12-06 LAB — RENAL FUNCTION PANEL
ALBUMIN: 4.6 g/dL (ref 3.5–5.5)
BUN/Creatinine Ratio: 17 (ref 9–20)
BUN: 19 mg/dL (ref 6–24)
CHLORIDE: 98 mmol/L (ref 96–106)
CO2: 22 mmol/L (ref 18–29)
Calcium: 9.6 mg/dL (ref 8.7–10.2)
Creatinine, Ser: 1.1 mg/dL (ref 0.76–1.27)
GFR calc non Af Amer: 80 mL/min/{1.73_m2} (ref >59)
GFR, EST AFRICAN AMERICAN: 92 mL/min/{1.73_m2} (ref >59)
GLUCOSE: 122 mg/dL — AB (ref 65–99)
PHOSPHORUS: 3.4 mg/dL (ref 2.5–4.5)
POTASSIUM: 4.3 mmol/L (ref 3.5–5.2)
Sodium: 138 mmol/L (ref 134–144)

## 2016-12-06 LAB — LIPID PANEL
CHOLESTEROL TOTAL: 158 mg/dL (ref 100–199)
Chol/HDL Ratio: 4.2 ratio units (ref 0.0–5.0)
HDL: 38 mg/dL — AB (ref >39)
LDL Calculated: 94 mg/dL (ref 0–99)
TRIGLYCERIDES: 128 mg/dL (ref 0–149)
VLDL CHOLESTEROL CAL: 26 mg/dL (ref 5–40)

## 2017-01-08 ENCOUNTER — Other Ambulatory Visit: Payer: Self-pay

## 2017-01-08 DIAGNOSIS — I10 Essential (primary) hypertension: Secondary | ICD-10-CM

## 2017-01-08 MED ORDER — LISINOPRIL 20 MG PO TABS: 20.0000 mg | ORAL_TABLET | Freq: Every day | ORAL | 1 refills | Status: DC

## 2017-06-19 ENCOUNTER — Other Ambulatory Visit: Payer: Self-pay | Admitting: Family Medicine

## 2017-06-19 DIAGNOSIS — E8881 Metabolic syndrome: Secondary | ICD-10-CM

## 2017-06-21 ENCOUNTER — Other Ambulatory Visit: Payer: Self-pay | Admitting: Family Medicine

## 2017-06-21 DIAGNOSIS — I10 Essential (primary) hypertension: Secondary | ICD-10-CM

## 2017-07-19 ENCOUNTER — Other Ambulatory Visit: Payer: Self-pay | Admitting: Family Medicine

## 2017-07-19 DIAGNOSIS — I10 Essential (primary) hypertension: Secondary | ICD-10-CM

## 2017-07-19 DIAGNOSIS — E8881 Metabolic syndrome: Secondary | ICD-10-CM

## 2017-07-27 ENCOUNTER — Other Ambulatory Visit: Payer: Self-pay | Admitting: Family Medicine

## 2017-07-27 DIAGNOSIS — I10 Essential (primary) hypertension: Secondary | ICD-10-CM

## 2017-08-22 ENCOUNTER — Other Ambulatory Visit: Payer: Self-pay | Admitting: Family Medicine

## 2017-08-22 DIAGNOSIS — I10 Essential (primary) hypertension: Secondary | ICD-10-CM

## 2017-09-08 ENCOUNTER — Other Ambulatory Visit: Payer: Self-pay | Admitting: Family Medicine

## 2017-09-08 DIAGNOSIS — I10 Essential (primary) hypertension: Secondary | ICD-10-CM

## 2017-09-23 ENCOUNTER — Ambulatory Visit: Payer: 59 | Admitting: Family Medicine

## 2017-09-24 ENCOUNTER — Encounter: Payer: Self-pay | Admitting: Family Medicine

## 2017-09-24 ENCOUNTER — Ambulatory Visit: Payer: 59 | Admitting: Family Medicine

## 2017-09-24 VITALS — BP 122/94 | HR 76 | Ht 71.0 in | Wt 346.0 lb

## 2017-09-24 DIAGNOSIS — E8881 Metabolic syndrome: Secondary | ICD-10-CM

## 2017-09-24 DIAGNOSIS — J0100 Acute maxillary sinusitis, unspecified: Secondary | ICD-10-CM | POA: Diagnosis not present

## 2017-09-24 DIAGNOSIS — I10 Essential (primary) hypertension: Secondary | ICD-10-CM | POA: Diagnosis not present

## 2017-09-24 MED ORDER — HYDROCHLOROTHIAZIDE 12.5 MG PO TABS: 12.5000 mg | ORAL_TABLET | Freq: Every day | ORAL | 1 refills | Status: DC

## 2017-09-24 MED ORDER — METFORMIN HCL 500 MG PO TABS: ORAL_TABLET | ORAL | 1 refills | Status: DC

## 2017-09-24 MED ORDER — LISINOPRIL 20 MG PO TABS: 20.0000 mg | ORAL_TABLET | Freq: Every day | ORAL | 1 refills | Status: DC

## 2017-09-24 MED ORDER — AMOXICILLIN 500 MG PO CAPS: 500.0000 mg | ORAL_CAPSULE | Freq: Three times a day (TID) | ORAL | 0 refills | Status: DC

## 2017-09-24 NOTE — Progress Notes (Signed)
Name: Wesley Hall   MRN: [id number]    DOB: 1971/07/20   Date:09/24/2017       Progress Note  Subjective  Chief Complaint  Chief Complaint  Patient presents with  . Hypertension    discuss the HCTZ- makes "me feel drained"  . Allergic Rhinitis     needs albuterol refilled/ wants a Rx on nasal spray  . Sinusitis    cough and cong    Hypertension  This is a chronic problem. The current episode started more than 1 year ago. The problem has been waxing and waning since onset. The problem is controlled. Pertinent negatives include no anxiety, blurred vision, chest pain, headaches, malaise/fatigue, neck pain, palpitations, peripheral edema, PND or shortness of breath. There are no known risk factors for coronary artery disease. Past treatments include ACE inhibitors. The current treatment provides mild improvement. There are no compliance problems.  There is no history of angina, kidney disease, CAD/MI, CVA, heart failure, left ventricular hypertrophy, PVD or retinopathy. There is no history of chronic renal disease, coarctation of the aorta, hyperaldosteronism, hypercortisolism, hyperparathyroidism, a hypertension causing med, pheochromocytoma, renovascular disease, sleep apnea or a thyroid problem.  Sinusitis  This is a new problem. The problem has been gradually improving since onset. There has been no fever. The pain is moderate. Associated symptoms include sinus pressure. Pertinent negatives include no chills, congestion, coughing, diaphoresis, ear pain, headaches, hoarse voice, neck pain, shortness of breath, sneezing, sore throat or swollen glands. Past treatments include nothing.    No problem-specific Assessment & Plan notes found for this encounter.   Past Medical History:  Diagnosis Date  . Athlete's foot   . Bronchitis   . Diabetes mellitus without complication (HCC)   . Hypertension   . Thyroid disease     Past Surgical History:  Procedure Laterality Date  .  CHOLECYSTECTOMY      History reviewed. No pertinent family history.  Social History   Socioeconomic History  . Marital status: Married    Spouse name: Not on file  . Number of children: Not on file  . Years of education: Not on file  . Highest education level: Not on file  Social Needs  . Financial resource strain: Not on file  . Food insecurity - worry: Not on file  . Food insecurity - inability: Not on file  . Transportation needs - medical: Not on file  . Transportation needs - non-medical: Not on file  Occupational History  . Not on file  Tobacco Use  . Smoking status: Never Smoker  . Smokeless tobacco: Never Used  Substance and Sexual Activity  . Alcohol use: No    Alcohol/week: 0.0 oz  . Drug use: No  . Sexual activity: Yes  Other Topics Concern  . Not on file  Social History Narrative  . Not on file    No Known Allergies  Outpatient Medications Prior to Visit  Medication Sig Dispense Refill  . albuterol (PROAIR HFA) 108 (90 Base) MCG/ACT inhaler     . B-D 3CC LUER-LOK SYR 18GX1-1/2 18G X 1-1/2" 3 ML MISC USE TO DRAW UP TESTOSTERONE FROM THE VIAL.  11  . BD DISP NEEDLES 22G X 1-1/2" MISC USE TO ADMINISTER IM TESTOSTERONE.  11  . hydrochlorothiazide (HYDRODIURIL) 25 MG tablet TAKE 1 TABLET BY MOUTH EVERY DAY *NEED APPT FOR FURTHER REFILLS* 7 tablet 0  . lisinopril (PRINIVIL,ZESTRIL) 20 MG tablet TAKE 1 TABLET BY MOUTH EVERY DAY 15 tablet 0  . metFORMIN (  GLUCOPHAGE) 500 MG tablet TAKE 1 TABLET BY MOUTH 2 TIMES DAILY WITH A MEAL. ** SCHEDULE APPT FOR MED REFILL ** 30 tablet 0  . clotrimazole-betamethasone (LOTRISONE) cream Apply 1 application topically 2 (two) times daily. (Patient not taking: Reported on 09/24/2017) 30 g 2  . testosterone cypionate (DEPOTESTOSTERONE CYPIONATE) 200 MG/ML injection Inject 0.75 mg into the muscle every 7 (seven) days. [facility]    . hydrochlorothiazide (HYDRODIURIL) 25 MG tablet Take 1 tablet (25 mg total) by mouth daily. 90  tablet 2   No facility-administered medications prior to visit.     Review of Systems  Constitutional: Negative for chills, diaphoresis, fever, malaise/fatigue and weight loss.  HENT: Positive for sinus pressure. Negative for congestion, ear discharge, ear pain, hoarse voice, sneezing and sore throat.   Eyes: Negative for blurred vision.  Respiratory: Negative for cough, sputum production, shortness of breath and wheezing.   Cardiovascular: Negative for chest pain, palpitations, leg swelling and PND.  Gastrointestinal: Negative for abdominal pain, blood in stool, constipation, diarrhea, heartburn, melena and nausea.  Genitourinary: Negative for dysuria, frequency, hematuria and urgency.  Musculoskeletal: Negative for back pain, joint pain, myalgias and neck pain.  Skin: Negative for rash.  Neurological: Negative for dizziness, tingling, sensory change, focal weakness and headaches.  Endo/Heme/Allergies: Negative for environmental allergies and polydipsia. Does not bruise/bleed easily.  Psychiatric/Behavioral: Negative for depression and suicidal ideas. The patient is not nervous/anxious and does not have insomnia.      Objective  Vitals:   09/24/17 0858  BP: (!) 122/94  Pulse: 76  Weight: (!) 346 lb (156.9 kg)  Height: 5\' 11"  (1.803 m)    Physical Exam  Constitutional: He is oriented to person, place, and time and well-developed, well-nourished, and in no distress.  HENT:  Head: Normocephalic.  Right Ear: Tympanic membrane and external ear normal.  Left Ear: Tympanic membrane and external ear normal.  Nose: Right sinus exhibits maxillary sinus tenderness. Left sinus exhibits maxillary sinus tenderness.  Mouth/Throat: Oropharynx is clear and moist.  Eyes: Conjunctivae and EOM are normal. Pupils are equal, round, and reactive to light. Right eye exhibits no discharge. Left eye exhibits no discharge. No scleral icterus.  Neck: Normal range of motion. Neck supple. No JVD present.  No tracheal deviation present. No thyromegaly present.  Cardiovascular: Normal rate, regular rhythm, normal heart sounds and intact distal pulses. Exam reveals no gallop and no friction rub.  No murmur heard. Pulmonary/Chest: Breath sounds normal. No respiratory distress. He has no wheezes. He has no rales.  Abdominal: Soft. Bowel sounds are normal. He exhibits no mass. There is no hepatosplenomegaly. There is no tenderness. There is no rebound, no guarding and no CVA tenderness.  Musculoskeletal: Normal range of motion. He exhibits no edema or tenderness.  Lymphadenopathy:    He has no cervical adenopathy.  Neurological: He is alert and oriented to person, place, and time. He has normal sensation, normal strength and intact cranial nerves. No cranial nerve deficit.  Skin: Skin is warm. No rash noted.  Psychiatric: Mood and affect normal.  Nursing note and vitals reviewed.     Assessment & Plan  Problem List Items Addressed This Visit      Cardiovascular and Mediastinum   Hypertension - Primary   Relevant Medications   lisinopril (PRINIVIL,ZESTRIL) 20 MG tablet   hydrochlorothiazide (HYDRODIURIL) 12.5 MG tablet     Other   Obesity, morbid, BMI 50 or higher (HCC)   Relevant Medications   metFORMIN (GLUCOPHAGE) 500  MG tablet    Other Visit Diagnoses    Metabolic syndrome       Relevant Medications   metFORMIN (GLUCOPHAGE) 500 MG tablet   Other Relevant Orders   Renal function panel   Lipid panel   Hemoglobin A1c   Ambulatory referral to Endocrinology   Acute maxillary sinusitis, recurrence not specified       Relevant Medications   amoxicillin (AMOXIL) 500 MG capsule      Meds ordered this encounter  Medications  . metFORMIN (GLUCOPHAGE) 500 MG tablet    Sig: TAKE 1 TABLET BY MOUTH 2 TIMES DAILY WITH A MEAL. ** SCHEDULE APPT FOR MED REFILL **    Dispense:  180 tablet    Refill:  1  . lisinopril (PRINIVIL,ZESTRIL) 20 MG tablet    Sig: Take 1 tablet (20 mg total)  daily by mouth.    Dispense:  90 tablet    Refill:  1    sched appt- not seen in 9 months  . hydrochlorothiazide (HYDRODIURIL) 12.5 MG tablet    Sig: Take 1 tablet (12.5 mg total) daily by mouth.    Dispense:  90 tablet    Refill:  1  . amoxicillin (AMOXIL) 500 MG capsule    Sig: Take 1 capsule (500 mg total) 3 (three) times daily by mouth.    Dispense:  30 capsule    Refill:  0      Dr. Elizabeth Sauereanna Jones [facility]  09/24/17

## 2017-09-25 LAB — RENAL FUNCTION PANEL
Albumin: 4.6 g/dL (ref 3.5–5.5)
BUN / CREAT RATIO: 14 (ref 9–20)
BUN: 15 mg/dL (ref 6–24)
CALCIUM: 10.1 mg/dL (ref 8.7–10.2)
CHLORIDE: 101 mmol/L (ref 96–106)
CO2: 23 mmol/L (ref 20–29)
CREATININE: 1.07 mg/dL (ref 0.76–1.27)
GFR calc Af Amer: 94 mL/min/{1.73_m2} (ref >59)
GFR calc non Af Amer: 82 mL/min/{1.73_m2} (ref >59)
GLUCOSE: 103 mg/dL — AB (ref 65–99)
PHOSPHORUS: 4.4 mg/dL (ref 2.5–4.5)
POTASSIUM: 4.8 mmol/L (ref 3.5–5.2)
SODIUM: 140 mmol/L (ref 134–144)

## 2017-09-25 LAB — LIPID PANEL
CHOLESTEROL TOTAL: 176 mg/dL (ref 100–199)
Chol/HDL Ratio: 4.3 ratio (ref 0.0–5.0)
HDL: 41 mg/dL (ref >39)
LDL CALC: 99 mg/dL (ref 0–99)
TRIGLYCERIDES: 180 mg/dL — AB (ref 0–149)
VLDL Cholesterol Cal: 36 mg/dL (ref 5–40)

## 2017-09-25 LAB — HEMOGLOBIN A1C
ESTIMATED AVERAGE GLUCOSE: 131 mg/dL
Hgb A1c MFr Bld: 6.2 % — ABNORMAL HIGH (ref 4.8–5.6)

## 2018-02-16 ENCOUNTER — Other Ambulatory Visit: Payer: Self-pay | Admitting: Family Medicine

## 2018-02-16 DIAGNOSIS — B354 Tinea corporis: Secondary | ICD-10-CM

## 2018-03-10 ENCOUNTER — Other Ambulatory Visit: Payer: Self-pay | Admitting: Family Medicine

## 2018-03-10 DIAGNOSIS — I10 Essential (primary) hypertension: Secondary | ICD-10-CM

## 2018-03-10 DIAGNOSIS — E8881 Metabolic syndrome: Secondary | ICD-10-CM

## 2018-04-08 ENCOUNTER — Other Ambulatory Visit: Payer: Self-pay | Admitting: Family Medicine

## 2018-04-08 DIAGNOSIS — E8881 Metabolic syndrome: Secondary | ICD-10-CM

## 2018-04-08 DIAGNOSIS — I10 Essential (primary) hypertension: Secondary | ICD-10-CM

## 2018-04-11 ENCOUNTER — Ambulatory Visit
Admission: EM | Admit: 2018-04-11 | Discharge: 2018-04-11 | Disposition: A | Discharge status: Home / Self Care | Payer: 59

## 2018-04-11 DIAGNOSIS — W228XXA Striking against or struck by other objects, initial encounter: Secondary | ICD-10-CM | POA: Diagnosis not present

## 2018-04-11 DIAGNOSIS — W270XXA Contact with workbench tool, initial encounter: Secondary | ICD-10-CM

## 2018-04-11 DIAGNOSIS — S0990XA Unspecified injury of head, initial encounter: Secondary | ICD-10-CM

## 2018-04-11 DIAGNOSIS — S0101XA Laceration without foreign body of scalp, initial encounter: Secondary | ICD-10-CM

## 2018-04-11 MED ORDER — TRIAMCINOLONE ACETONIDE 0.1 % EX CREA: 1.0000 "application " | TOPICAL_CREAM | Freq: Two times a day (BID) | CUTANEOUS | 0 refills | Status: DC

## 2018-04-11 NOTE — ED Provider Notes (Signed)
[facility]    CSN: [id number] Arrival date & time: 04/11/18  1054     History   Chief Complaint Chief Complaint  Patient presents with  . Head Laceration    HPI Wesley Hall is a 47 y.o. male.   Presents to the urgent care facility for evaluation of scalp laceration.  Patient states he was using a crowbar to pry up decking when the crowbar slipped and he hit himself in the head.  Patient has a linear laceration along the frontal portion of the scalp with bleeding that is well controlled.  His tetanus is up-to-date.  He denies any headache, loss of consciousness, nausea or vomiting.  Family member is with him states he is been alert and oriented with no signs of confusion.  Patient denies any vision changes.  His pain is mild only around the laceration site.  No blood thinners. HPI  Past Medical History:  Diagnosis Date  . Athlete's foot   . Bronchitis   . Diabetes mellitus without complication (HCC)   . Hypertension   . Thyroid disease     Patient Active Problem List   Diagnosis Date Noted  . History of kidney stones 02/08/2016  . Obesity, morbid, BMI 50 or higher (HCC) 02/08/2016  . Prediabetes 02/08/2016  . Hypogonadism in male 08/22/2015  . Hypertension 12/31/2012    Past Surgical History:  Procedure Laterality Date  . CHOLECYSTECTOMY         Home Medications    Prior to Admission medications   Medication Sig Start Date End Date Taking? Authorizing Provider  clomiPHENE (CLOMID) 50 MG tablet Take by mouth daily.   Yes [provider]  hydrochlorothiazide (HYDRODIURIL) 12.5 MG tablet TAKE 1 TABLET (12.5 MG TOTAL) DAILY BY MOUTH. 04/08/18  Yes Duanne Limerick, MD  lisinopril (PRINIVIL,ZESTRIL) 20 MG tablet Take 1 tablet (20 mg total) daily by mouth. 09/24/17  Yes Duanne Limerick, MD  metFORMIN (GLUCOPHAGE) 500 MG tablet TAKE 1 TABLET BY MOUTH 2 TIMES DAILY WITH A MEAL. ** SCHEDULE APPT FOR MED REFILL ** 04/08/18  Yes Duanne Limerick, MD    albuterol (PROAIR HFA) 108 (90 Base) MCG/ACT inhaler  12/26/15   [provider]  amoxicillin (AMOXIL) 500 MG capsule Take 1 capsule (500 mg total) 3 (three) times daily by mouth. 09/24/17   Duanne Limerick, MD  B-D 3CC LUER-LOK SYR 18GX1-1/2 18G X 1-1/2" 3 ML MISC USE TO DRAW UP TESTOSTERONE FROM THE VIAL. 12/24/15   [provider]  BD DISP NEEDLES 22G X 1-1/2" MISC USE TO ADMINISTER IM TESTOSTERONE. 11/23/15   [provider]  clotrimazole-betamethasone (LOTRISONE) cream APPLY 1 APPLICATION TOPICALLY 2 (TWO) TIMES DAILY. 02/16/18   Duanne Limerick, MD  testosterone cypionate (DEPOTESTOSTERONE CYPIONATE) 200 MG/ML injection Inject 0.75 mg into the muscle every 7 (seven) days. [facility]    [provider]    Family History No family history on file.  Social History Social History   Tobacco Use  . Smoking status: Never Smoker  . Smokeless tobacco: Never Used  Substance Use Topics  . Alcohol use: No    Alcohol/week: 0.0 oz  . Drug use: No     Allergies   Patient has no known allergies.   Review of Systems Review of Systems  Constitutional: Negative for fever.  Eyes: Negative for visual disturbance.  Gastrointestinal: Negative for nausea and vomiting.  Skin: Positive for wound.  Neurological: Negative for dizziness, light-headedness and headaches.  Physical Exam Triage Vital Signs ED Triage Vitals  Enc Vitals Group     BP 04/11/18 1113 98/70     Pulse Rate 04/11/18 1113 93     Resp 04/11/18 1113 18     Temp 04/11/18 1113 (!) 97.5 F (36.4 C)     Temp Source 04/11/18 1113 Oral     SpO2 04/11/18 1113 97 %     Weight --      Height --      Head Circumference --      Peak Flow --      Pain Score 04/11/18 1111 4     Pain Loc --      Pain Edu? --      Excl. in GC? --    No data found.  Updated Vital Signs BP 98/70 (BP Location: Left Arm)   Pulse 93   Temp (!) 97.5 F (36.4 C) (Oral)   Resp 18   SpO2 97%   Visual  Acuity Right Eye Distance:   Left Eye Distance:   Bilateral Distance:    Right Eye Near:   Left Eye Near:    Bilateral Near:     Physical Exam  Constitutional: He is oriented to person, place, and time. He appears well-developed and well-nourished.  HENT:  Head: Normocephalic and atraumatic.  5.5 cm linear laceration to the frontal portion of the scalp along the midline.  Laceration is well approximated with no visible or palpable foreign body.  No tenderness along the skull concerning for skull fracture.  No hematoma noted.  Eyes: Conjunctivae are normal.  Neck: Normal range of motion.  Cardiovascular: Normal rate.  Pulmonary/Chest: Effort normal. No respiratory distress.  Musculoskeletal: Normal range of motion.  Neurological: He is alert and oriented to person, place, and time. No cranial nerve deficit. Coordination normal.  Skin: Skin is warm. No rash noted.  Psychiatric: He has a normal mood and affect. His behavior is normal. Thought content normal.     UC Treatments / Results  Labs (all labs ordered are listed, but only abnormal results are displayed) Labs Reviewed - No data to display  EKG None  Radiology No results found.  Procedures Laceration Repair Date/Time: 04/11/2018 11:50 AM Performed by: Evon Slack, PA-C Authorized by: Evon Slack, PA-C   Consent:    Consent obtained:  Verbal   Consent given by:  Patient   Risks discussed:  Infection   Alternatives discussed:  No treatment Anesthesia (see MAR for exact dosages):    Anesthesia method:  Local infiltration   Local anesthetic:  Lidocaine 1% WITH epi Laceration details:    Location:  Scalp   Length (cm):  5.5   Depth (mm):  2 Repair type:    Repair type:  Simple Pre-procedure details:    Preparation:  Patient was prepped and draped in usual sterile fashion Exploration:    Wound exploration: wound explored through full range of motion and entire depth of wound probed and visualized     Treatment:    Area cleansed with:  Betadine   Irrigation solution:  Sterile saline   Irrigation method:  Pressure wash Skin repair:    Repair method:  Staples   Number of staples:  6 Approximation:    Approximation:  Close Post-procedure details:    Dressing:  Open (no dressing)   Patient tolerance of procedure:  Tolerated well, no immediate complications   (including critical care time)  Medications Ordered in UC Medications - No  data to display  Initial Impression / Assessment and Plan / UC Course  I have reviewed the triage vital signs and the nursing notes.  Pertinent labs & imaging results that were available during my care of the patient were reviewed by me and considered in my medical decision making (see chart for details).    47 year old male with laceration to the forehead.  Laceration repaired with staples.  He is educated on wound care and reasons to return to ED for.  Final Clinical Impressions(s) / UC Diagnoses   Final diagnoses:  Laceration of scalp, initial encounter  Injury of head, initial encounter     Discharge Instructions     You may shower and get laceration site wet but do not submerge underwater for 1 week.  Follow-up with PCP or urgent care in 1 week for staple removal.  Apply ice to the head 20 minutes every hour.  He may take Tylenol as needed for pain.  If any severe headaches, nausea, vomiting return to the clinic immediately.   ED Prescriptions    None       Evon SlackGaines, Thomas C, [place] JerseyPA-C 04/11/18 1206

## 2018-04-11 NOTE — ED Triage Notes (Signed)
Pt was doing deck work and hit his self in the head with a metal pole. States he is up to date on his tdap from last year.

## 2018-04-11 NOTE — Discharge Instructions (Signed)
You may shower and get laceration site wet but do not submerge underwater for 1 week.  Follow-up with PCP or urgent care in 1 week for staple removal.  Apply ice to the head 20 minutes every hour.  He may take Tylenol as needed for pain.  If any severe headaches, nausea, vomiting return to the clinic immediately.

## 2018-04-12 ENCOUNTER — Encounter: Payer: Self-pay | Admitting: Family Medicine

## 2018-04-12 ENCOUNTER — Ambulatory Visit: Payer: 59 | Admitting: Family Medicine

## 2018-04-12 VITALS — BP 130/70 | HR 72 | Ht 71.0 in | Wt 338.0 lb

## 2018-04-12 DIAGNOSIS — I9589 Other hypotension: Secondary | ICD-10-CM

## 2018-04-12 DIAGNOSIS — E860 Dehydration: Secondary | ICD-10-CM

## 2018-04-12 DIAGNOSIS — E861 Hypovolemia: Secondary | ICD-10-CM | POA: Diagnosis not present

## 2018-04-12 DIAGNOSIS — M722 Plantar fascial fibromatosis: Secondary | ICD-10-CM

## 2018-04-12 DIAGNOSIS — I10 Essential (primary) hypertension: Secondary | ICD-10-CM | POA: Diagnosis not present

## 2018-04-12 DIAGNOSIS — R252 Cramp and spasm: Secondary | ICD-10-CM | POA: Diagnosis not present

## 2018-04-12 MED ORDER — LISINOPRIL 10 MG PO TABS: 10.0000 mg | ORAL_TABLET | Freq: Every day | ORAL | 0 refills | Status: DC

## 2018-04-12 NOTE — Progress Notes (Signed)
Name: Wesley Hall   MRN: [id number]    DOB: [birth year]   Date:04/12/2018       Progress Note  Subjective  Chief Complaint  Chief Complaint  Patient presents with  . Hypertension    was seen in urgent care- B/P was 97/76 (hadn't taken med)    Hypertension  This is a chronic problem. The current episode started in the past 7 days. The problem has been gradually improving (noted to be low) since onset. The problem is controlled. Pertinent negatives include no anxiety, blurred vision, chest pain, headaches, malaise/fatigue, neck pain, orthopnea, palpitations, peripheral edema, PND, shortness of breath or sweats. There are no associated agents to hypertension. There are no known risk factors for coronary artery disease. Past treatments include ACE inhibitors and diuretics. The current treatment provides moderate improvement. There are no compliance problems.  There is no history of angina, kidney disease, CVA, heart failure, left ventricular hypertrophy, PVD or retinopathy. There is no history of chronic renal disease, a hypertension causing med or renovascular disease.  Foot Injury   Incident onset: for foot pain for 2 year. There was no injury mechanism. The pain is present in the left foot. The quality of the pain is described as aching and stabbing. The pain is at a severity of 6/10. The pain is moderate. The pain has been intermittent since onset. Associated symptoms include an inability to bear weight. Pertinent negatives include no loss of motion, loss of sensation, muscle weakness, numbness or tingling. The symptoms are aggravated by movement. The treatment provided mild relief.    Hypertension Recent low readings will d/c hctz and continue lowered lisinopril tfor diabetic nephropathy   Past Medical History:  Diagnosis Date  . Athlete's foot   . Bronchitis   . Diabetes mellitus without complication (HCC)   . Hypertension   . Thyroid disease     Past Surgical History:  Procedure  Laterality Date  . CHOLECYSTECTOMY      History reviewed. No pertinent family history.  Social History   Socioeconomic History  . Marital status: Married    Spouse name: Not on file  . Number of children: Not on file  . Years of education: Not on file  . Highest education level: Not on file  Occupational History  . Not on file  Social Needs  . Financial resource strain: Not on file  . Food insecurity:    Worry: Not on file    Inability: Not on file  . Transportation needs:    Medical: Not on file    Non-medical: Not on file  Tobacco Use  . Smoking status: Never Smoker  . Smokeless tobacco: Never Used  Substance and Sexual Activity  . Alcohol use: No    Alcohol/week: 0.0 oz  . Drug use: No  . Sexual activity: Yes  Lifestyle  . Physical activity:    Days per week: Not on file    Minutes per session: Not on file  . Stress: Not on file  Relationships  . Social connections:    Talks on phone: Not on file    Gets together: Not on file    Attends religious service: Not on file    Active member of club or organization: Not on file    Attends meetings of clubs or organizations: Not on file    Relationship status: Not on file  . Intimate partner violence:    Fear of current or ex partner: Not on file    Emotionally  abused: Not on file    Physically abused: Not on file    Forced sexual activity: Not on file  Other Topics Concern  . Not on file  Social History Narrative  . Not on file    No Known Allergies  Outpatient Medications Prior to Visit  Medication Sig Dispense Refill  . albuterol (PROAIR HFA) 108 (90 Base) MCG/ACT inhaler     . B-D 3CC LUER-LOK SYR 18GX1-1/2 18G X 1-1/2" 3 ML MISC USE TO DRAW UP TESTOSTERONE FROM THE VIAL.  11  . BD DISP NEEDLES 22G X 1-1/2" MISC USE TO ADMINISTER IM TESTOSTERONE.  11  . clomiPHENE (CLOMID) 50 MG tablet Take 25 mg by mouth daily. Endo- Dr Gershon Crane'Connell    . clotrimazole-betamethasone (LOTRISONE) cream APPLY 1 APPLICATION  TOPICALLY 2 (TWO) TIMES DAILY. 30 g 0  . metFORMIN (GLUCOPHAGE) 500 MG tablet TAKE 1 TABLET BY MOUTH 2 TIMES DAILY WITH A MEAL. ** SCHEDULE APPT FOR MED REFILL ** (Patient taking differently: TAKE 1 TABLET BY MOUTH 2 TIMES DAILY WITH A MEAL. Dr Val EagleElveria Rising' Connell) 30 tablet 0  . testosterone cypionate (DEPOTESTOSTERONE CYPIONATE) 200 MG/ML injection Inject 0.75 mg into the muscle every 7 (seven) days. Dr Gershon Crane'connell    . triamcinolone cream (KENALOG) 0.1 % Apply 1 application topically 2 (two) times daily. 30 g 0  . hydrochlorothiazide (HYDRODIURIL) 12.5 MG tablet TAKE 1 TABLET (12.5 MG TOTAL) DAILY BY MOUTH. 15 tablet 0  . lisinopril (PRINIVIL,ZESTRIL) 20 MG tablet Take 1 tablet (20 mg total) daily by mouth. 90 tablet 1  . amoxicillin (AMOXIL) 500 MG capsule Take 1 capsule (500 mg total) 3 (three) times daily by mouth. 30 capsule 0   No facility-administered medications prior to visit.     Review of Systems  Constitutional: Negative for chills, fever, malaise/fatigue and weight loss.  HENT: Negative for ear discharge, ear pain and sore throat.   Eyes: Negative for blurred vision.  Respiratory: Negative for cough, sputum production, shortness of breath and wheezing.   Cardiovascular: Negative for chest pain, palpitations, orthopnea, leg swelling and PND.  Gastrointestinal: Negative for abdominal pain, blood in stool, constipation, diarrhea, heartburn, melena and nausea.  Genitourinary: Negative for dysuria, frequency, hematuria and urgency.  Musculoskeletal: Negative for back pain, joint pain, myalgias and neck pain.  Skin: Negative for rash.  Neurological: Negative for dizziness, tingling, sensory change, focal weakness, numbness and headaches.  Endo/Heme/Allergies: Negative for environmental allergies and polydipsia. Does not bruise/bleed easily.  Psychiatric/Behavioral: Negative for depression and suicidal ideas. The patient is not nervous/anxious and does not have insomnia.       Objective  Vitals:   04/12/18 0927  BP: 130/70  Pulse: 72  Weight: (!) 338 lb (153.3 kg)  Height: 5\' 11"  (1.803 m)    Physical Exam  Constitutional: He is oriented to person, place, and time.  HENT:  Head: Normocephalic.  Right Ear: External ear normal.  Left Ear: External ear normal.  Nose: Nose normal.  Mouth/Throat: Oropharynx is clear and moist.  Eyes: Pupils are equal, round, and reactive to light. Conjunctivae and EOM are normal. Right eye exhibits no discharge. Left eye exhibits no discharge. No scleral icterus.  Neck: Normal range of motion. Neck supple. No JVD present. No tracheal deviation present. No thyromegaly present.  Cardiovascular: Normal rate, regular rhythm, normal heart sounds and intact distal pulses. Exam reveals no gallop and no friction rub.  No murmur heard. Pulmonary/Chest: Breath sounds normal. No respiratory distress. He has no wheezes. He has  no rales.  Abdominal: Soft. Bowel sounds are normal. He exhibits no mass. There is no hepatosplenomegaly. There is no tenderness. There is no rebound, no guarding and no CVA tenderness.  Musculoskeletal: Normal range of motion. He exhibits no edema.       Left foot: There is tenderness.  Tender origin plantar fasciia  Lymphadenopathy:    He has no cervical adenopathy.  Neurological: He is alert and oriented to person, place, and time. He has normal strength and normal reflexes. No cranial nerve deficit.  Skin: Skin is warm. No rash noted.  Nursing note and vitals reviewed.     Assessment & Plan  Problem List Items Addressed This Visit      Cardiovascular and Mediastinum   Hypertension    Recent low readings will d/c hctz and continue lowered lisinopril tfor diabetic nephropathy      Relevant Medications   lisinopril (PRINIVIL,ZESTRIL) 10 MG tablet    Other Visit Diagnoses    Hypotension due to hypovolemia    -  Primary   recent recordings of bp either low or normanl off medications. Wd/c HCTZ  and decrease lisinopril to 10 mg.   Relevant Medications   lisinopril (PRINIVIL,ZESTRIL) 10 MG tablet   Plantar fasciitis       Pain in left foot for >2 years ,suggest Nsaid and will referr to orthopedics   Relevant Orders   Ambulatory referral to Podiatry   Dehydration       S/P working outdoors ,encourage fluid intake.   Nocturnal muscle cramps       rehydrate/ mustard suggested for tryp channels      Meds ordered this encounter  Medications  . lisinopril (PRINIVIL,ZESTRIL) 10 MG tablet    Sig: Take 1 tablet (10 mg total) by mouth daily.    Dispense:  90 tablet    Refill:  0      Dr. Elizabeth Sauer [facility]  04/12/18

## 2018-04-12 NOTE — Assessment & Plan Note (Signed)
Recent low readings will d/c hctz and continue lowered lisinopril tfor diabetic nephropathy

## 2018-04-18 ENCOUNTER — Other Ambulatory Visit: Payer: Self-pay

## 2018-04-18 ENCOUNTER — Ambulatory Visit
Admission: EM | Admit: 2018-04-18 | Discharge: 2018-04-18 | Disposition: A | Discharge status: Home / Self Care | Payer: 59

## 2018-04-18 NOTE — ED Notes (Signed)
6 staples removed from scalp. Edges well approximated, no sx infection.

## 2018-04-18 NOTE — ED Triage Notes (Signed)
Pt with laceration to top of head one week ago. Here for staple removal. No sx infection.

## 2018-04-25 ENCOUNTER — Other Ambulatory Visit: Payer: Self-pay | Admitting: Family Medicine

## 2018-04-25 DIAGNOSIS — E8881 Metabolic syndrome: Secondary | ICD-10-CM

## 2018-04-25 DIAGNOSIS — I10 Essential (primary) hypertension: Secondary | ICD-10-CM

## 2018-06-01 ENCOUNTER — Ambulatory Visit
Admission: EM | Admit: 2018-06-01 | Discharge: 2018-06-01 | Disposition: A | Discharge status: Home / Self Care | Payer: 59 | Attending: Family Medicine | Admitting: Family Medicine

## 2018-06-01 ENCOUNTER — Encounter: Payer: Self-pay | Admitting: Emergency Medicine

## 2018-06-01 ENCOUNTER — Other Ambulatory Visit: Payer: Self-pay

## 2018-06-01 DIAGNOSIS — L03115 Cellulitis of right lower limb: Secondary | ICD-10-CM | POA: Diagnosis not present

## 2018-06-01 DIAGNOSIS — I872 Venous insufficiency (chronic) (peripheral): Secondary | ICD-10-CM | POA: Diagnosis not present

## 2018-06-01 MED ORDER — DOXYCYCLINE HYCLATE 100 MG PO CAPS: 100.0000 mg | ORAL_CAPSULE | Freq: Two times a day (BID) | ORAL | 0 refills | Status: DC

## 2018-06-01 NOTE — Discharge Instructions (Addendum)
Wear good quality medical grade compression hose of medium compression during work hours.  Elevate your leg 5 to 15 minutes several times daily as able.

## 2018-06-01 NOTE — ED Provider Notes (Addendum)
[facility]    CSN: [id number] Arrival date & time: 06/01/18  4098     History   Chief Complaint Chief Complaint  Patient presents with  . Leg Pain    HPI Wesley Hall is a 47 y.o. male.   HPI  47 year old obese male presents with swelling and redness to his right lower leg that is noticed over the previous 3 days.  States that he is concerned because he has had cellulitis before.  He will get insect bites frequently.  His dog will also lick his leg at night which has caused cellulitis in  previous times. He  Is morbidly obese.  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  Legs will swell towards the end of the evening.  He has had no fever chills.  He has had no draining wounds from the legs.  Does take testosterone injections.. He also is taking metformin for prediabetes.        Past Medical History:  Diagnosis Date  . Athlete's foot   . Bronchitis   . Diabetes mellitus without complication (HCC)   . Hypertension   . Thyroid disease     Patient Active Problem List   Diagnosis Date Noted  . History of kidney stones 02/08/2016  . Obesity, morbid, BMI 50 or higher (HCC) 02/08/2016  . Prediabetes 02/08/2016  . Hypogonadism in male 08/22/2015  . Hypertension 12/31/2012    Past Surgical History:  Procedure Laterality Date  . CHOLECYSTECTOMY         Home Medications    Prior to Admission medications   Medication Sig Start Date End Date Taking? Authorizing Provider  albuterol (PROAIR HFA) 108 (90 Base) MCG/ACT inhaler  12/26/15  Yes [provider]  B-D 3CC LUER-LOK SYR 18GX1-1/2 18G X 1-1/2" 3 ML MISC USE TO DRAW UP TESTOSTERONE FROM THE VIAL. 12/24/15  Yes [provider]  BD DISP NEEDLES 22G X 1-1/2" MISC USE TO ADMINISTER IM TESTOSTERONE. 11/23/15  Yes [provider]  clomiPHENE (CLOMID) 50 MG tablet Take 25 mg by mouth daily. Endo- Dr Gershon Crane   Yes [provider]    clotrimazole-betamethasone (LOTRISONE) cream APPLY 1 APPLICATION TOPICALLY 2 (TWO) TIMES DAILY. 02/16/18  Yes Duanne Limerick, MD  hydrochlorothiazide (HYDRODIURIL) 12.5 MG tablet TAKE 1 TABLET BY MOUTH DAILY 04/26/18  Yes Duanne Limerick, MD  lisinopril (PRINIVIL,ZESTRIL) 10 MG tablet Take 1 tablet (10 mg total) by mouth daily. 04/12/18  Yes Duanne Limerick, MD  metFORMIN (GLUCOPHAGE) 500 MG tablet TAKE 1 TABLET BY MOUTH 2 TIMES DAILY WITH A MEAL. ** SCHEDULE APPT FOR MED REFILL ** Patient taking differently: TAKE 1 TABLET BY MOUTH 2 TIMES DAILY WITH A MEAL. Dr Val Eagle' Elveria Rising 04/08/18  Yes Duanne Limerick, MD  testosterone cypionate (DEPOTESTOSTERONE CYPIONATE) 200 MG/ML injection Inject 0.75 mg into the muscle every 7 (seven) days. Dr Gershon Crane   Yes [provider]  triamcinolone cream (KENALOG) 0.1 % Apply 1 application topically 2 (two) times daily. 04/11/18  Yes Evon Slack, PA-C  doxycycline (VIBRAMYCIN) 100 MG capsule Take 1 capsule (100 mg total) by mouth 2 (two) times daily. 06/01/18   Lutricia Feil, PA-C    Family History No family history on file.  Social History Social History   Tobacco Use  . Smoking status: Never Smoker  . Smokeless tobacco: Never Used  Substance Use Topics  . Alcohol use: No  Alcohol/week: 0.0 oz  . Drug use: No     Allergies   Patient has no known allergies.   Review of Systems Review of Systems  Constitutional: Positive for activity change. Negative for appetite change, chills, fatigue and fever.  Skin: Positive for color change and rash.  All other systems reviewed and are negative.    Physical Exam Triage Vital Signs ED Triage Vitals  Enc Vitals Group     BP 06/01/18 0852 (!) 134/93     Pulse Rate 06/01/18 0852 70     Resp 06/01/18 0852 18     Temp 06/01/18 0852 98.1 F (36.7 C)     Temp Source 06/01/18 0852 Oral     SpO2 06/01/18 0852 100 %     Weight 06/01/18 0849 (!) 335 lb (152 kg)     Height 06/01/18 0849 5\' 10"   (1.778 m)     Head Circumference --      Peak Flow --      Pain Score 06/01/18 0849 4     Pain Loc --      Pain Edu? --      Excl. in GC? --    No data found.  Updated Vital Signs BP (!) 134/93 (BP Location: Left Arm)   Pulse 70   Temp 98.1 F (36.7 C) (Oral)   Resp 18   Ht 5\' 10"  (1.778 m)   Wt (!) 335 lb (152 kg)   SpO2 100%   BMI 48.07 kg/m   Visual Acuity Right Eye Distance:   Left Eye Distance:   Bilateral Distance:    Right Eye Near:   Left Eye Near:    Bilateral Near:     Physical Exam  Constitutional: He appears well-developed and well-nourished. No distress.  HENT:  Head: Normocephalic.  Eyes: Pupils are equal, round, and reactive to light.  Neck: Normal range of motion.  Musculoskeletal: Normal range of motion.  Neurological: He is alert.  Skin: Skin is warm and dry. Rash noted. He is not diaphoretic. There is erythema.  Circumferential non-blanchable dermatitis of the lower right extremity. He also has this on the left but to a lesser degree.  There is no pitting edema present.  No significant swelling in comparison to the opposite side.  However the right foot and ankle have swelling there is no warmth present.  There are no cords palpable.  PERC score is low and his PE severity score is less than 58 making him a class I.  Psychiatric: He has a normal mood and affect. His behavior is normal. Judgment and thought content normal.  Nursing note and vitals reviewed.          UC Treatments / Results  Labs (all labs ordered are listed, but only abnormal results are displayed) Labs Reviewed - No data to display  EKG None  Radiology No results found.  Procedures Procedures (including critical care time)  Medications Ordered in UC Medications - No data to display  Initial Impression / Assessment and Plan / UC Course  I have reviewed the triage vital signs and the nursing notes.  Pertinent labs & imaging results that were available during my  care of the patient were reviewed by me and considered in my medical decision making (see chart for details).     Plan: 1. Test/x-ray results and diagnosis reviewed with patient 2. rx as per orders; risks, benefits, potential side effects reviewed with patient 3. Recommend supportive treatment with getting his legs  elevated as much as feasible 5 to 15 minutes out of every hour . I highly recommend that he obtain good quality medical grade compression hose of median compression to use at work.  For the possibility of a secondary cellulitis I will prescribe doxycycline for short term use.  If he is not improving he should make an appointment with his primary care physician Dr. Yetta Barre.   4. F/u prn if symptoms worsen or don't improve  Final Clinical Impressions(s) / UC Diagnoses   Final diagnoses:  Venous stasis dermatitis of right lower extremity  Cellulitis of leg, right     Discharge Instructions     Wear good quality medical grade compression hose of medium compression during work hours.  Elevate your leg 5 to 15 minutes several times daily as able.    ED Prescriptions    Medication Sig Dispense Auth. Provider   doxycycline (VIBRAMYCIN) 100 MG capsule Take 1 capsule (100 mg total) by mouth 2 (two) times daily. 10 capsule Lutricia Feil, PA-C     Controlled Substance Prescriptions [place] Controlled Substance Registry consulted? Not Applicable   Lutricia Feil, PA-C 06/01/18 1610    Lutricia Feil, PA-C 06/01/18 [phone]

## 2018-06-01 NOTE — ED Triage Notes (Signed)
Patient c/o swelling and redness to right lower leg x 3 days. Patient reports that he has had cellulitis before.

## 2018-06-07 ENCOUNTER — Ambulatory Visit: Payer: 59 | Admitting: Family Medicine

## 2018-06-07 ENCOUNTER — Encounter: Payer: Self-pay | Admitting: Family Medicine

## 2018-06-07 VITALS — BP 122/84 | HR 80 | Ht 70.0 in | Wt 337.0 lb

## 2018-06-07 DIAGNOSIS — I10 Essential (primary) hypertension: Secondary | ICD-10-CM | POA: Diagnosis not present

## 2018-06-07 DIAGNOSIS — M7989 Other specified soft tissue disorders: Secondary | ICD-10-CM

## 2018-06-07 MED ORDER — HYDROCHLOROTHIAZIDE 12.5 MG PO TABS: 12.5000 mg | ORAL_TABLET | Freq: Every day | ORAL | 1 refills | Status: DC

## 2018-06-07 MED ORDER — LISINOPRIL 10 MG PO TABS
10.0000 mg | ORAL_TABLET | Freq: Every day | ORAL | 1 refills | Status: DC
Start: 2018-06-07 — End: 2019-01-03

## 2018-06-07 NOTE — Assessment & Plan Note (Signed)
Stable on HCTZ 12.5mg  and Lisinopril 10mg - continue as prescribed

## 2018-06-07 NOTE — Assessment & Plan Note (Addendum)
Discussed diet/ Weight loss

## 2018-06-07 NOTE — Progress Notes (Signed)
Name: Wesley Hall   MRN: [id number]    DOB: 21-Jul-1971   Date:06/07/2018       Progress Note  Subjective  Chief Complaint  Chief Complaint  Patient presents with  . Follow-up    B/P med was changed from 20mg  to 10mg - recheck B/p    Hypertension  This is a chronic problem. The current episode started more than 1 year ago. The problem has been waxing and waning since onset. The problem is controlled. Pertinent negatives include no anxiety, blurred vision, chest pain, headaches, malaise/fatigue, neck pain, orthopnea, palpitations, peripheral edema, PND, shortness of breath or sweats. There are no associated agents to hypertension. Past treatments include ACE inhibitors and alpha 1 blockers. The current treatment provides moderate improvement. There are no compliance problems.  There is no history of angina, kidney disease, CAD/MI, CVA, heart failure, left ventricular hypertrophy, PVD or retinopathy. There is no history of chronic renal disease, a hypertension causing med or renovascular disease.    Hypertension Stable on HCTZ 12.5mg  and Lisinopril 10mg - continue as prescribed  Obesity, morbid, BMI 50 or higher (HCC) Discussed diet/ Weight loss   Past Medical History:  Diagnosis Date  . Athlete's foot   . Bronchitis   . Diabetes mellitus without complication (HCC)   . Hypertension   . Thyroid disease     Past Surgical History:  Procedure Laterality Date  . CHOLECYSTECTOMY      No family history on file.  Social History   Socioeconomic History  . Marital status: Married    Spouse name: Not on file  . Number of children: Not on file  . Years of education: Not on file  . Highest education level: Not on file  Occupational History  . Not on file  Social Needs  . Financial resource strain: Not on file  . Food insecurity:    Worry: Not on file    Inability: Not on file  . Transportation needs:    Medical: Not on file    Non-medical: Not on file  Tobacco Use  .  Smoking status: Never Smoker  . Smokeless tobacco: Never Used  Substance and Sexual Activity  . Alcohol use: No    Alcohol/week: 0.0 oz  . Drug use: No  . Sexual activity: Yes  Lifestyle  . Physical activity:    Days per week: Not on file    Minutes per session: Not on file  . Stress: Not on file  Relationships  . Social connections:    Talks on phone: Not on file    Gets together: Not on file    Attends religious service: Not on file    Active member of club or organization: Not on file    Attends meetings of clubs or organizations: Not on file    Relationship status: Not on file  . Intimate partner violence:    Fear of current or ex partner: Not on file    Emotionally abused: Not on file    Physically abused: Not on file    Forced sexual activity: Not on file  Other Topics Concern  . Not on file  Social History Narrative  . Not on file    No Known Allergies  Outpatient Medications Prior to Visit  Medication Sig Dispense Refill  . albuterol (PROAIR HFA) 108 (90 Base) MCG/ACT inhaler     . B-D 3CC LUER-LOK SYR 18GX1-1/2 18G X 1-1/2" 3 ML MISC USE TO DRAW UP TESTOSTERONE FROM THE VIAL.  11  .  BD DISP NEEDLES 22G X 1-1/2" MISC USE TO ADMINISTER IM TESTOSTERONE.  11  . clomiPHENE (CLOMID) 50 MG tablet Take 25 mg by mouth daily. Endo- Dr Gershon Crane    . clotrimazole-betamethasone (LOTRISONE) cream APPLY 1 APPLICATION TOPICALLY 2 (TWO) TIMES DAILY. 30 g 0  . etodolac (LODINE) 500 MG tablet Take 500 mg by mouth 2 (two) times daily.  1  . metFORMIN (GLUCOPHAGE) 500 MG tablet TAKE 1 TABLET BY MOUTH 2 TIMES DAILY WITH A MEAL. ** SCHEDULE APPT FOR MED REFILL ** (Patient taking differently: TAKE 1 TABLET BY MOUTH 2 TIMES DAILY WITH A MEAL. Dr Val EagleElveria Rising) 30 tablet 0  . testosterone cypionate (DEPOTESTOSTERONE CYPIONATE) 200 MG/ML injection Inject 0.75 mg into the muscle every 7 (seven) days. Dr Gershon Crane    . triamcinolone cream (KENALOG) 0.1 % Apply 1 application topically 2 (two)  times daily. 30 g 0  . hydrochlorothiazide (HYDRODIURIL) 12.5 MG tablet TAKE 1 TABLET BY MOUTH DAILY 90 tablet 1  . lisinopril (PRINIVIL,ZESTRIL) 10 MG tablet Take 1 tablet (10 mg total) by mouth daily. 90 tablet 0  . doxycycline (VIBRAMYCIN) 100 MG capsule Take 1 capsule (100 mg total) by mouth 2 (two) times daily. 10 capsule 0   No facility-administered medications prior to visit.     Review of Systems  Constitutional: Negative for chills, fever, malaise/fatigue and weight loss.  HENT: Negative for ear discharge, ear pain and sore throat.   Eyes: Negative for blurred vision.  Respiratory: Negative for cough, sputum production, shortness of breath and wheezing.   Cardiovascular: Negative for chest pain, palpitations, orthopnea, leg swelling and PND.  Gastrointestinal: Negative for abdominal pain, blood in stool, constipation, diarrhea, heartburn, melena and nausea.  Genitourinary: Negative for dysuria, frequency, hematuria and urgency.  Musculoskeletal: Negative for back pain, joint pain, myalgias and neck pain.  Skin: Negative for rash.  Neurological: Negative for dizziness, tingling, sensory change, focal weakness and headaches.  Endo/Heme/Allergies: Negative for environmental allergies and polydipsia. Does not bruise/bleed easily.  Psychiatric/Behavioral: Negative for depression and suicidal ideas. The patient is not nervous/anxious and does not have insomnia.      Objective  Vitals:   06/07/18 0933  BP: 122/84  Pulse: 80  Weight: (!) 337 lb (152.9 kg)  Height: 5\' 10"  (1.778 m)    Physical Exam  Constitutional: He is oriented to person, place, and time.  HENT:  Head: Normocephalic.  Right Ear: External ear normal.  Left Ear: External ear normal.  Nose: Nose normal.  Mouth/Throat: Oropharynx is clear and moist.  Eyes: Pupils are equal, round, and reactive to light. Conjunctivae and EOM are normal. Right eye exhibits no discharge. Left eye exhibits no discharge. No scleral  icterus.  Neck: Normal range of motion. Neck supple. No JVD present. No tracheal deviation present. No thyromegaly present.  Cardiovascular: Normal rate, regular rhythm, normal heart sounds and intact distal pulses. Exam reveals no gallop and no friction rub.  No murmur heard. Pulmonary/Chest: Breath sounds normal. No respiratory distress. He has no wheezes. He has no rales.  Abdominal: Soft. Bowel sounds are normal. He exhibits no mass. There is no hepatosplenomegaly. There is no tenderness. There is no rebound, no guarding and no CVA tenderness.  Musculoskeletal: Normal range of motion. He exhibits no tenderness.       Right lower leg: He exhibits swelling and edema. He exhibits no tenderness, no bony tenderness, no deformity and no laceration.  Right leg 20 inches vs left leg 19.5 inches  Lymphadenopathy:  He has no cervical adenopathy.  Neurological: He is alert and oriented to person, place, and time. He has normal strength and normal reflexes. No cranial nerve deficit.  Skin: Skin is warm. No rash noted.  Nursing note and vitals reviewed.     Assessment & Plan  Problem List Items Addressed This Visit      Cardiovascular and Mediastinum   Hypertension - Primary    Stable on HCTZ 12.5mg  and Lisinopril 10mg - continue as prescribed      Relevant Medications   hydrochlorothiazide (HYDRODIURIL) 12.5 MG tablet   lisinopril (PRINIVIL,ZESTRIL) 10 MG tablet     Other   Obesity, morbid, BMI 50 or higher (HCC)    Discussed diet/ Weight loss       Other Visit Diagnoses    Right leg swelling       ordered US of lower right extremity   Relevant Orders   US Venous Img Lower Unilateral Right      Meds ordered this encounter  Medications  . hydrochlorothiazide (HYDRODIURIL) 12.5 MG tablet    Sig: Take 1 tablet (12.5 mg total) by mouth daily.    Dispense:  90 tablet    Refill:  1  . lisinopril (PRINIVIL,ZESTRIL) 10 MG tablet    Sig: Take 1 tablet (10 mg total) by mouth daily.     Dispense:  90 tablet    Refill:  1      Dr. Elizabeth Sauer [facility]  06/07/18

## 2018-06-10 ENCOUNTER — Ambulatory Visit: Payer: 59 | Attending: Family Medicine

## 2018-12-16 ENCOUNTER — Other Ambulatory Visit: Payer: Self-pay | Admitting: Family Medicine

## 2018-12-16 DIAGNOSIS — I10 Essential (primary) hypertension: Secondary | ICD-10-CM

## 2019-01-03 ENCOUNTER — Other Ambulatory Visit: Payer: Self-pay | Admitting: Family Medicine

## 2019-01-03 DIAGNOSIS — I10 Essential (primary) hypertension: Secondary | ICD-10-CM

## 2019-01-05 ENCOUNTER — Other Ambulatory Visit: Payer: Self-pay | Admitting: Family Medicine

## 2019-01-05 DIAGNOSIS — I10 Essential (primary) hypertension: Secondary | ICD-10-CM

## 2019-01-08 ENCOUNTER — Other Ambulatory Visit: Payer: Self-pay | Admitting: Family Medicine

## 2019-01-08 DIAGNOSIS — I10 Essential (primary) hypertension: Secondary | ICD-10-CM

## 2019-01-20 ENCOUNTER — Other Ambulatory Visit: Payer: Self-pay | Admitting: Family Medicine

## 2019-01-20 DIAGNOSIS — I10 Essential (primary) hypertension: Secondary | ICD-10-CM

## 2019-02-08 ENCOUNTER — Other Ambulatory Visit: Payer: Self-pay | Admitting: Family Medicine

## 2019-02-08 DIAGNOSIS — I10 Essential (primary) hypertension: Secondary | ICD-10-CM

## 2019-02-14 ENCOUNTER — Encounter: Payer: Self-pay | Admitting: Family Medicine

## 2019-02-14 ENCOUNTER — Other Ambulatory Visit: Payer: Self-pay

## 2019-02-14 ENCOUNTER — Ambulatory Visit: Payer: 59 | Admitting: Family Medicine

## 2019-02-14 VITALS — BP 118/82 | HR 68 | Ht 70.0 in | Wt 329.0 lb

## 2019-02-14 DIAGNOSIS — B354 Tinea corporis: Secondary | ICD-10-CM

## 2019-02-14 DIAGNOSIS — L03116 Cellulitis of left lower limb: Secondary | ICD-10-CM | POA: Diagnosis not present

## 2019-02-14 DIAGNOSIS — I872 Venous insufficiency (chronic) (peripheral): Secondary | ICD-10-CM

## 2019-02-14 DIAGNOSIS — Z6841 Body Mass Index (BMI) 40.0 and over, adult: Secondary | ICD-10-CM

## 2019-02-14 DIAGNOSIS — I10 Essential (primary) hypertension: Secondary | ICD-10-CM

## 2019-02-14 MED ORDER — CLOTRIMAZOLE-BETAMETHASONE 1-0.05 % EX CREA: 1.0000 "application " | TOPICAL_CREAM | Freq: Two times a day (BID) | CUTANEOUS | 0 refills | Status: DC

## 2019-02-14 MED ORDER — SULFAMETHOXAZOLE-TRIMETHOPRIM 800-160 MG PO TABS: 1.0000 | ORAL_TABLET | Freq: Two times a day (BID) | ORAL | 0 refills | Status: DC

## 2019-02-14 MED ORDER — HYDROCHLOROTHIAZIDE 12.5 MG PO TABS: 12.5000 mg | ORAL_TABLET | Freq: Every day | ORAL | 1 refills | Status: DC

## 2019-02-14 NOTE — Patient Instructions (Addendum)

## 2019-02-14 NOTE — Progress Notes (Signed)
Date:  02/14/2019   Name:  Wesley Hall   DOB:  1971/08/17   MRN:  [id number]   Chief Complaint: Hypertension and Rash (refill clotrimazole cream/ L) leg had fireants- now looks like cellulitis that is spreading up leg)  Hypertension  This is a chronic problem. The current episode started more than 1 year ago. The problem has been waxing and waning since onset. The problem is controlled. Pertinent negatives include no anxiety, blurred vision, chest pain, headaches, malaise/fatigue, neck pain, orthopnea, palpitations, peripheral edema, PND, shortness of breath or sweats. There are no associated agents to hypertension. There are no known risk factors for coronary artery disease. Past treatments include diuretics. The current treatment provides moderate improvement. There are no compliance problems.  There is no history of angina, kidney disease, CAD/MI, CVA, heart failure, left ventricular hypertrophy, PVD or retinopathy. There is no history of chronic renal disease, a hypertension causing med or renovascular disease.  Rash  This is a chronic problem. The current episode started 1 to 4 weeks ago. The problem has been waxing and waning since onset. The affected locations include the left ankle and left lower leg. The rash is characterized by redness and itchiness. Associated symptoms include congestion, a fever and rhinorrhea. Pertinent negatives include no anorexia, cough, diarrhea, eye pain, facial edema, fatigue, joint pain, nail changes, shortness of breath, sore throat or vomiting. (Allergic rhinitis) Past treatments include topical steroids (topical antifungal).    Review of Systems  Constitutional: Positive for fever. Negative for chills, fatigue and malaise/fatigue.  HENT: Positive for congestion and rhinorrhea. Negative for drooling, ear discharge, ear pain and sore throat.   Eyes: Negative for blurred vision and pain.  Respiratory: Negative for cough, shortness of breath and wheezing.    Cardiovascular: Negative for chest pain, palpitations, orthopnea, leg swelling and PND.  Gastrointestinal: Negative for abdominal pain, anorexia, blood in stool, constipation, diarrhea, nausea and vomiting.  Endocrine: Negative for polydipsia.  Genitourinary: Negative for dysuria, frequency, hematuria and urgency.  Musculoskeletal: Negative for back pain, joint pain, myalgias and neck pain.  Skin: Positive for rash. Negative for nail changes.  Allergic/Immunologic: Negative for environmental allergies.  Neurological: Negative for dizziness and headaches.  Hematological: Does not bruise/bleed easily.  Psychiatric/Behavioral: Negative for suicidal ideas. The patient is not nervous/anxious.     Patient Active Problem List   Diagnosis Date Noted  . History of kidney stones 02/08/2016  . Obesity, morbid, BMI 50 or higher (HCC) 02/08/2016  . Prediabetes 02/08/2016  . Hypogonadism in male 08/22/2015  . Hypertension 12/31/2012    No Known Allergies  Past Surgical History:  Procedure Laterality Date  . CHOLECYSTECTOMY      Social History   Tobacco Use  . Smoking status: Never Smoker  . Smokeless tobacco: Never Used  Substance Use Topics  . Alcohol use: No    Alcohol/week: 0.0 standard drinks  . Drug use: No     Medication list has been reviewed and updated.  Current Meds  Medication Sig  . B-D 3CC LUER-LOK SYR 18GX1-1/2 18G X 1-1/2" 3 ML MISC USE TO DRAW UP TESTOSTERONE FROM THE VIAL.  Marland Kitchen BD DISP NEEDLES 22G X 1-1/2" MISC USE TO ADMINISTER IM TESTOSTERONE.  Marland Kitchen clomiPHENE (CLOMID) 50 MG tablet Take 25 mg by mouth daily. Endo- Dr Gershon Crane  . clotrimazole-betamethasone (LOTRISONE) cream APPLY 1 APPLICATION TOPICALLY 2 (TWO) TIMES DAILY.  . hydrochlorothiazide (HYDRODIURIL) 12.5 MG tablet TAKE 1 TABLET BY MOUTH EVERY DAY  . phentermine (ADIPEX-P)  37.5 MG tablet Take 1 tablet by mouth daily. Dr Gershon Crane  . triamcinolone cream (KENALOG) 0.1 % Apply 1 application topically 2 (two)  times daily.    PHQ 2/9 Scores 04/12/2018 02/08/2016  PHQ - 2 Score 0 0  PHQ- 9 Score 3 -    BP Readings from Last 3 Encounters:  02/14/19 118/82  06/07/18 122/84  06/01/18 (!) 134/93    Physical Exam Vitals signs and nursing note reviewed.  HENT:     Head: Normocephalic and atraumatic.     Right Ear: Tympanic membrane, ear canal and external ear normal.     Left Ear: Tympanic membrane, ear canal and external ear normal.     Nose: Nose normal.  Eyes:     General: No scleral icterus.       Right eye: No discharge.        Left eye: No discharge.     Conjunctiva/sclera: Conjunctivae normal.     Pupils: Pupils are equal, round, and reactive to light.  Neck:     Musculoskeletal: Normal range of motion and neck supple.     Thyroid: No thyromegaly.     Vascular: No JVD.     Trachea: No tracheal deviation.  Cardiovascular:     Rate and Rhythm: Normal rate and regular rhythm.     Heart sounds: Normal heart sounds. No murmur. No friction rub. No gallop.   Pulmonary:     Effort: No respiratory distress.     Breath sounds: Normal breath sounds. No wheezing or rales.  Abdominal:     General: Bowel sounds are normal.     Palpations: Abdomen is soft. There is no mass.     Tenderness: There is no abdominal tenderness. There is no guarding or rebound.  Musculoskeletal: Normal range of motion.        General: No tenderness.  Lymphadenopathy:     Cervical: No cervical adenopathy.  Skin:    General: Skin is warm.     Findings: No rash.  Neurological:     Mental Status: He is alert and oriented to person, place, and time.     Cranial Nerves: No cranial nerve deficit.     Deep Tendon Reflexes: Reflexes are normal and symmetric.     Wt Readings from Last 3 Encounters:  02/14/19 (!) 329 lb (149.2 kg)  06/07/18 (!) 337 lb (152.9 kg)  06/01/18 (!) 335 lb (152 kg)    BP 118/82   Pulse 68   Ht 5\' 10"  (1.778 m)   Wt (!) 329 lb (149.2 kg)   BMI 47.21 kg/m   Assessment and Plan:  1. BMI 45.0-49.9, adult [facility]) Health risks of being over weight were discussed and patient was counseled on weight loss options and exercise.  2. Tinea corporis Patient has rash consistent with Candida versus tinea along the pannus.  Will initiate Chlortrimazole betamethasone twice a day. - clotrimazole-betamethasone (LOTRISONE) cream; Apply 1 application topically 2 (two) times daily.  Dispense: 30 g; Refill: 0  3. Essential hypertension Chronic.  Controlled.  Continue hydrochlorothiazide 12.5.  We had a discussion about the ACE inhibitor and if beneficial effect for his diabetes on his kidneys patient is aware of this. - hydrochlorothiazide (HYDRODIURIL) 12.5 MG tablet; Take 1 tablet (12.5 mg total) by mouth daily.  Dispense: 90 tablet; Refill: 1  4. Cellulitis of left lower extremity Patient has a partially treated cellulitis of the left leg which he did not finish his complete course of doxycycline.  Will  initiate sulfa DS twice daily for 10 days and will recheck as needed. - sulfamethoxazole-trimethoprim (BACTRIM DS,SEPTRA DS) 800-160 MG tablet; Take 1 tablet by mouth 2 (two) times daily.  Dispense: 20 tablet; Refill: 0  5. Venous insufficiency Patient has still swelling of his lower legs for which she did not receive treatment evaluation by vascular clinic.  Patient will be referred to a vein and vascular clinic however he will return a call to us concerning his insurance allowance for this specialty evaluation.

## 2019-02-16 NOTE — Addendum Note (Signed)
Addended by: Everitt Amber on: 02/16/2019 01:06 PM   Modules accepted: Orders

## 2019-02-25 ENCOUNTER — Other Ambulatory Visit: Payer: Self-pay

## 2019-02-25 ENCOUNTER — Ambulatory Visit (INDEPENDENT_AMBULATORY_CARE_PROVIDER_SITE_OTHER): Payer: 59 | Admitting: Vascular Surgery

## 2019-02-25 ENCOUNTER — Encounter (INDEPENDENT_AMBULATORY_CARE_PROVIDER_SITE_OTHER): Payer: Self-pay | Admitting: Vascular Surgery

## 2019-02-25 VITALS — BP 132/93 | HR 66 | Resp 16 | Ht 71.0 in | Wt 337.0 lb

## 2019-02-25 DIAGNOSIS — M79604 Pain in right leg: Secondary | ICD-10-CM

## 2019-02-25 DIAGNOSIS — R600 Localized edema: Secondary | ICD-10-CM

## 2019-02-25 DIAGNOSIS — E119 Type 2 diabetes mellitus without complications: Secondary | ICD-10-CM | POA: Diagnosis not present

## 2019-02-25 DIAGNOSIS — M79605 Pain in left leg: Secondary | ICD-10-CM

## 2019-02-25 DIAGNOSIS — I10 Essential (primary) hypertension: Secondary | ICD-10-CM

## 2019-02-25 DIAGNOSIS — M7989 Other specified soft tissue disorders: Secondary | ICD-10-CM | POA: Insufficient documentation

## 2019-02-25 DIAGNOSIS — M79609 Pain in unspecified limb: Secondary | ICD-10-CM | POA: Insufficient documentation

## 2019-02-25 NOTE — Patient Instructions (Signed)

## 2019-02-25 NOTE — Progress Notes (Signed)
Patient ID: Wesley Hall, male   DOB: December 27, 1970, 48 y.o.   MRN: [id number]  Chief Complaint  Patient presents with  . New Patient (Initial Visit)    ref Wesley Hall for cellulitis    HPI Wesley Hall is a 48 y.o. male.  I am asked to see the patient by Dr. Yetta Hall for evaluation of recurrent cellulitis of the lower extremities as well as pain and swelling.  The patient has had a recent episode of left leg cellulitis which was his second episode of left leg cellulitis in the last year or 2.  Prior to that, he is also had right leg cellulitis.  Outside of the episodes of cellulitis, he has had worsening swelling and discomfort in the legs over the past few years.  This has been gradual in its onset without clear inciting event or causative factor.  The patient reports no previous DVT or superficial thrombophlebitis to his knowledge.  He has had fluctuations in his weight and understands that his weight is a risk factor for leg pain and swelling.  His most recent episode of cellulitis responded well to antibiotic therapy as they generally have in the past.  He was given sulfa antibiotics and the redness and pain quickly resolved.  He is still bothered by the swelling and discomfort in the legs..     Past Medical History:  Diagnosis Date  . Athlete's foot   . Bronchitis   . Diabetes mellitus without complication (HCC)   . Hypertension   . Thyroid disease     Past Surgical History:  Procedure Laterality Date  . CHOLECYSTECTOMY      Family History No bleeding disorders, clotting disorders, autoimmune diseases, or aneurysms  Social History Social History   Tobacco Use  . Smoking status: Never Smoker  . Smokeless tobacco: Never Used  Substance Use Topics  . Alcohol use: No    Alcohol/week: 0.0 standard drinks  . Drug use: No     No Known Allergies  Current Outpatient Medications  Medication Sig Dispense Refill  . albuterol (PROAIR HFA) 108 (90 Base) MCG/ACT inhaler     .  B-D 3CC LUER-LOK SYR 18GX1-1/2 18G X 1-1/2" 3 ML MISC USE TO DRAW UP TESTOSTERONE FROM THE VIAL.  11  . BD DISP NEEDLES 22G X 1-1/2" MISC USE TO ADMINISTER IM TESTOSTERONE.  11  . clomiPHENE (CLOMID) 50 MG tablet Take 25 mg by mouth daily. Endo- Dr Gershon Crane    . clotrimazole-betamethasone (LOTRISONE) cream Apply 1 application topically 2 (two) times daily. 30 g 0  . hydrochlorothiazide (HYDRODIURIL) 12.5 MG tablet Take 1 tablet (12.5 mg total) by mouth daily. 90 tablet 1  . phentermine (ADIPEX-P) 37.5 MG tablet Take 1 tablet by mouth daily. Dr Gershon Crane    . sulfamethoxazole-trimethoprim (BACTRIM DS,SEPTRA DS) 800-160 MG tablet Take 1 tablet by mouth 2 (two) times daily. 20 tablet 0  . triamcinolone cream (KENALOG) 0.1 % Apply 1 application topically 2 (two) times daily. 30 g 0  . metFORMIN (GLUCOPHAGE) 500 MG tablet TAKE 1 TABLET BY MOUTH 2 TIMES DAILY WITH A MEAL. ** SCHEDULE APPT FOR MED REFILL ** (Patient not taking: Reported on 02/14/2019) 30 tablet 0   No current facility-administered medications for this visit.       REVIEW OF SYSTEMS (Negative unless checked)  Constitutional: Weight loss  Fever  Chills Cardiac: Chest pain   Chest pressure   Palpitations   Shortness of breath when laying flat   Shortness  of breath at rest   [] Shortness of breath with exertion. Vascular:  [x] Pain in legs with walking   [x] Pain in legs at rest   [] Pain in legs when laying flat   [] Claudication   [] Pain in feet when walking  [] Pain in feet at rest  [] Pain in feet when laying flat   [] History of DVT   [] Phlebitis   [x] Swelling in legs   [] Varicose veins   [] Non-healing ulcers Pulmonary:   [] Uses home oxygen   [] Productive cough   [] Hemoptysis   [] Wheeze  [] COPD   [] Asthma Neurologic:  [] Dizziness  [] Blackouts   [] Seizures   [] History of stroke   [] History of TIA  [] Aphasia   [] Temporary blindness   [] Dysphagia   [] Weakness or numbness in arms   [] Weakness or numbness in legs Musculoskeletal:   [x] Arthritis   [] Joint swelling   [] Joint pain   [] Low back pain Hematologic:  [] Easy bruising  [] Easy bleeding   [] Hypercoagulable state   [] Anemic  [] Hepatitis Gastrointestinal:  [] Blood in stool   [] Vomiting blood  [] Gastroesophageal reflux/heartburn   [] Abdominal pain Genitourinary:  [] Chronic kidney disease   [] Difficult urination  [] Frequent urination  [] Burning with urination   [] Hematuria Skin:  [] Rashes   [] Ulcers   [] Wounds Psychological:  [] History of anxiety   []  History of major depression.    Physical Exam BP (!) 132/93 (BP Location: Right Arm)   Pulse 66   Resp 16   Ht 5\' 11"  (1.803 m)   Wt (!) 337 lb (152.9 kg)   BMI 47.00 kg/m  Gen:  WD/WN, NAD. Obese.  Head: [place]/AT, No temporalis wasting.  Ear/Nose/Throat: Hearing grossly intact, nares w/o erythema or drainage, oropharynx w/o Erythema/Exudate Eyes: Conjunctiva clear, sclera non-icteric  Neck: trachea midline.  No JVD.  Pulmonary:  Good air movement, respirations not labored, no use of accessory muscles  Cardiac: RRR, no JVD Vascular:  Vessel Right Left  Radial Palpable Palpable                          PT 2+ 1+  DP 2+ 2+   Gastrointestinal:. No masses, surgical incisions, or scars. Musculoskeletal: M/S 5/5 throughout.  Extremities without ischemic changes.  No deformity or atrophy. 1+ BLE edema. Neurologic: Sensation grossly intact in extremities.  Symmetrical.  Speech is fluent. Motor exam as listed above. Psychiatric: Judgment intact, Mood & affect appropriate for pt's clinical situation. Dermatologic: No rashes or ulcers noted.  No cellulitis or open wounds.    Radiology No results found.  Labs No results found for this or any previous visit (from the past 2160 hour(s)).  Assessment/Plan:  Diabetes (HCC) blood glucose control important in reducing the progression of atherosclerotic disease. Also, involved in wound healing. On appropriate medications.   Hypertension blood pressure control  important in reducing the progression of atherosclerotic disease. On appropriate oral medications.   Pain in limb With recurrent cellulitic episodes and chronic swelling, the patient almost certainly has developed lymphedema from scarring and lymphatic channels.  Venous insufficiency could be contributing as well.  See work-up as planned below.  Swelling of limb I have had a long discussion with the patient regarding swelling and why it  causes symptoms.  Patient will begin wearing graduated compression stockings class 1 (20-30 mmHg) on a daily basis a prescription was given. The patient will  beginning wearing the stockings first thing in the morning and removing them in the evening. The patient is instructed specifically  not to sleep in the stockings.   In addition, behavioral modification will be initiated.  This will include frequent elevation, use of over the counter pain medications and exercise such as walking.  I have reviewed systemic causes for chronic edema such as liver, kidney and cardiac etiologies.  The patient denies problems with these organ systems.    Consideration for a lymph pump will also be made based upon the effectiveness of conservative therapy.  This would help to improve the edema control and prevent sequela such as ulcers and infections   Patient should undergo duplex ultrasound of the venous system to ensure that DVT or reflux is not present.  The patient will follow-up with me after the ultrasound.        Festus Barren 02/25/2019, 10:11 AM   This note was created with Dragon medical transcription system.  Any errors from dictation are unintentional.

## 2019-02-25 NOTE — Assessment & Plan Note (Signed)

## 2019-02-25 NOTE — Assessment & Plan Note (Signed)
With recurrent cellulitic episodes and chronic swelling, the patient almost certainly has developed lymphedema from scarring and lymphatic channels.  Venous insufficiency could be contributing as well.  See work-up as planned below.

## 2019-02-25 NOTE — Assessment & Plan Note (Signed)
blood pressure control important in reducing the progression of atherosclerotic disease. On appropriate oral medications.  

## 2019-02-25 NOTE — Assessment & Plan Note (Signed)
blood glucose control important in reducing the progression of atherosclerotic disease. Also, involved in wound healing. On appropriate medications.  

## 2019-03-11 ENCOUNTER — Other Ambulatory Visit: Payer: Self-pay

## 2019-03-11 ENCOUNTER — Ambulatory Visit (INDEPENDENT_AMBULATORY_CARE_PROVIDER_SITE_OTHER): Payer: 59

## 2019-03-11 ENCOUNTER — Ambulatory Visit (INDEPENDENT_AMBULATORY_CARE_PROVIDER_SITE_OTHER): Payer: 59 | Admitting: Vascular Surgery

## 2019-03-11 ENCOUNTER — Encounter (INDEPENDENT_AMBULATORY_CARE_PROVIDER_SITE_OTHER): Payer: Self-pay | Admitting: Vascular Surgery

## 2019-03-11 VITALS — BP 144/99 | HR 81 | Resp 16 | Ht 71.0 in | Wt 335.0 lb

## 2019-03-11 DIAGNOSIS — M79605 Pain in left leg: Secondary | ICD-10-CM | POA: Diagnosis not present

## 2019-03-11 DIAGNOSIS — M7989 Other specified soft tissue disorders: Secondary | ICD-10-CM

## 2019-03-11 DIAGNOSIS — Z79899 Other long term (current) drug therapy: Secondary | ICD-10-CM

## 2019-03-11 DIAGNOSIS — M79604 Pain in right leg: Secondary | ICD-10-CM

## 2019-03-11 DIAGNOSIS — E119 Type 2 diabetes mellitus without complications: Secondary | ICD-10-CM

## 2019-03-11 DIAGNOSIS — I890 Lymphedema, not elsewhere classified: Secondary | ICD-10-CM

## 2019-03-11 DIAGNOSIS — Z7984 Long term (current) use of oral hypoglycemic drugs: Secondary | ICD-10-CM

## 2019-03-11 DIAGNOSIS — R600 Localized edema: Secondary | ICD-10-CM | POA: Diagnosis not present

## 2019-03-11 DIAGNOSIS — I10 Essential (primary) hypertension: Secondary | ICD-10-CM

## 2019-03-11 NOTE — Progress Notes (Signed)
MRN : [id number]  Wesley Hall is a 48 y.o. (08-Jan-1971) male who presents with chief complaint of  Chief Complaint  Patient presents with  . Follow-up    ultrasound follow up  .  History of Present Illness: Patient returns today in follow up of his leg swelling, discoloration, and pain.  Since he was last seen, he has had a change in his job duties and he is doing a lot more walking.  This has resulted in slight improvement in terms of the swelling, pain, and discoloration of both of his legs.  He reports no other major changes.  He went to the store to get compression stockings but they were closed because of coronavirus concerns.  He is studied with noninvasive studies today which demonstrate no deep venous thrombosis or superficial thrombophlebitis.  He has venous reflux in the common femoral veins bilaterally but that is isolated.  No significant superficial venous reflux is found in either lower extremity  Current Outpatient Medications  Medication Sig Dispense Refill  . albuterol (PROAIR HFA) 108 (90 Base) MCG/ACT inhaler     . B-D 3CC LUER-LOK SYR 18GX1-1/2 18G X 1-1/2" 3 ML MISC USE TO DRAW UP TESTOSTERONE FROM THE VIAL.  11  . BD DISP NEEDLES 22G X 1-1/2" MISC USE TO ADMINISTER IM TESTOSTERONE.  11  . clomiPHENE (CLOMID) 50 MG tablet Take 25 mg by mouth daily. Endo- Dr Gershon Crane    . clotrimazole-betamethasone (LOTRISONE) cream Apply 1 application topically 2 (two) times daily. 30 g 0  . hydrochlorothiazide (HYDRODIURIL) 12.5 MG tablet Take 1 tablet (12.5 mg total) by mouth daily. 90 tablet 1  . phentermine (ADIPEX-P) 37.5 MG tablet Take 1 tablet by mouth daily. Dr Gershon Crane    . sulfamethoxazole-trimethoprim (BACTRIM DS,SEPTRA DS) 800-160 MG tablet Take 1 tablet by mouth 2 (two) times daily. 20 tablet 0  . triamcinolone cream (KENALOG) 0.1 % Apply 1 application topically 2 (two) times daily. 30 g 0  . metFORMIN (GLUCOPHAGE) 500 MG tablet TAKE 1 TABLET BY MOUTH 2 TIMES DAILY  WITH A MEAL. ** SCHEDULE APPT FOR MED REFILL ** (Patient not taking: Reported on 02/14/2019) 30 tablet 0   No current facility-administered medications for this visit.     Past Medical History:  Diagnosis Date  . Athlete's foot   . Bronchitis   . Diabetes mellitus without complication (HCC)   . Hypertension   . Thyroid disease     Past Surgical History:  Procedure Laterality Date  . CHOLECYSTECTOMY      Social History Social History   Tobacco Use  . Smoking status: Never Smoker  . Smokeless tobacco: Never Used  Substance Use Topics  . Alcohol use: No    Alcohol/week: 0.0 standard drinks  . Drug use: No    Family History No bleeding or clotting disorders  No Known Allergies   REVIEW OF SYSTEMS (Negative unless checked)  Constitutional: [] ?Weight loss  [] ?Fever  [] ?Chills Cardiac: [] ?Chest pain   [] ?Chest pressure   [] ?Palpitations   [] ?Shortness of breath when laying flat   [] ?Shortness of breath at rest   [] ?Shortness of breath with exertion. Vascular:  [x] ?Pain in legs with walking   [x] ?Pain in legs at rest   [] ?Pain in legs when laying flat   [] ?Claudication   [] ?Pain in feet when walking  [] ?Pain in feet at rest  [] ?Pain in feet when laying flat   [] ?History of DVT   [] ?Phlebitis   [x] ?Swelling in legs   [] ?  Varicose veins   ?Non-healing ulcers Pulmonary:   ?Uses home oxygen   ?Productive cough   ?Hemoptysis   ?Wheeze  ?COPD   ?Asthma Neurologic:  ?Dizziness  ?Blackouts   ?Seizures   ?History of stroke   ?History of TIA  ?Aphasia   ?Temporary blindness   ?Dysphagia   ?Weakness or numbness in arms   ?Weakness or numbness in legs Musculoskeletal:  ?Arthritis   ?Joint swelling   ?Joint pain   ?Low back pain Hematologic:  ?Easy bruising  ?Easy bleeding   ?Hypercoagulable state   ?Anemic  ?Hepatitis Gastrointestinal:  ?Blood in stool   ?Vomiting blood  ?Gastroesophageal reflux/heartburn   ?Abdominal pain  Genitourinary:  ?Chronic kidney disease   ?Difficult urination  ?Frequent urination  ?Burning with urination   ?Hematuria Skin:  ?Rashes   ?Ulcers   ?Wounds Psychological:  ?History of anxiety   ? History of major depression.  Physical Examination  BP (!) 144/99 (BP Location: Right Arm)   Pulse 81   Resp 16   Ht  (1.803 m)   Wt (!) 335 lb (152 kg)   BMI 46.72 kg/m  Gen:  WD/WN, NAD. obese Head: [place]/AT, No temporalis wasting. Ear/Nose/Throat: Hearing grossly intact, nares w/o erythema or drainage Eyes: Conjunctiva clear. Sclera non-icteric Neck: Supple.  Trachea midline Pulmonary:  Good air movement, no use of accessory muscles.  Cardiac: RRR, no JVD Vascular:  Vessel Right Left  Radial Palpable Palpable                          PT Palpable 1+Palpable  DP Palpable Palpable   Gastrointestinal: soft, non-tender/non-distended. No guarding/reflex.  Musculoskeletal: M/S 5/5 throughout.  No deformity or atrophy.  1+ bilateral lower extremity edema.  Stasis changes are present bilaterally and dermal thickening present. Neurologic: Sensation grossly intact in extremities.  Symmetrical.  Speech is fluent.  Psychiatric: Judgment intact, Mood & affect appropriate for pt's clinical situation. Dermatologic: No rashes or ulcers noted.  No cellulitis or open wounds.       Labs No results found for this or any previous visit (from the past 2160 hour(s)).  Radiology No results found.  Assessment/Plan Diabetes (HCC) blood glucose control important in reducing the progression of atherosclerotic disease. Also, involved in wound healing. On appropriate medications.   Hypertension blood pressure control important in reducing the progression of atherosclerotic disease. On appropriate oral medications.   Pain in limb With recurrent cellulitic episodes and chronic swelling, the patient almost certainly has developed lymphedema from scarring and lymphatic  channels.  Swelling of limb Slightly improved with increasing activity but still prominent.  Lymphedema He is studied with noninvasive studies today which demonstrate no deep venous thrombosis or superficial thrombophlebitis.  He has venous reflux in the common femoral veins bilaterally but that is isolated.  No significant superficial venous reflux is found in either lower extremity The patient has developed significant lymphedema after his cellulitis and chronic scarring of the lymphatic channels.  He does have skin changes and would be stage II lymphedema.  In addition to compression stockings, elevation, weight loss, and increasing activity, a lymphedema pump would be an excellent adjuvant therapy to add to his regimen.  I have discussed how these work and the reason and rationale for treatment.  All the other conservative measures are very important to continue as well.  I will see him back in 3 to 4 months in follow-up to assess his response to treatment.  Festus BarrenJason Nakeeta Sebastiani, MD  03/11/2019 3:58 PM    This note was created with Dragon medical transcription system.  Any errors from dictation are purely unintentional

## 2019-03-11 NOTE — Assessment & Plan Note (Signed)
He is studied with noninvasive studies today which demonstrate no deep venous thrombosis or superficial thrombophlebitis.  He has venous reflux in the common femoral veins bilaterally but that is isolated.  No significant superficial venous reflux is found in either lower extremity The patient has developed significant lymphedema after his cellulitis and chronic scarring of the lymphatic channels.  He does have skin changes and would be stage II lymphedema.  In addition to compression stockings, elevation, weight loss, and increasing activity, a lymphedema pump would be an excellent adjuvant therapy to add to his regimen.  I have discussed how these work and the reason and rationale for treatment.  All the other conservative measures are very important to continue as well.  I will see him back in 3 to 4 months in follow-up to assess his response to treatment.

## 2019-03-11 NOTE — Assessment & Plan Note (Signed)
Slightly improved with increasing activity but still prominent.

## 2019-03-11 NOTE — Patient Instructions (Signed)

## 2019-04-18 ENCOUNTER — Ambulatory Visit: Payer: 59 | Admitting: Family Medicine

## 2019-04-18 ENCOUNTER — Other Ambulatory Visit: Payer: Self-pay

## 2019-04-18 ENCOUNTER — Encounter: Payer: Self-pay | Admitting: Family Medicine

## 2019-04-18 VITALS — BP 138/102 | HR 88 | Ht 71.0 in | Wt 335.0 lb

## 2019-04-18 DIAGNOSIS — I10 Essential (primary) hypertension: Secondary | ICD-10-CM | POA: Diagnosis not present

## 2019-04-18 MED ORDER — LOSARTAN POTASSIUM 50 MG PO TABS: 50.0000 mg | ORAL_TABLET | Freq: Every day | ORAL | 1 refills | Status: DC

## 2019-04-18 NOTE — Progress Notes (Signed)
Date:  04/18/2019   Name:  Wesley Hall   DOB:  1971-02-17   MRN:  [id number]   Chief Complaint: Follow-up (from sending to vein specialist- have upcoming appt with Honor Junes this week. Stopped taking Lisinopril)  Hypertension  This is a chronic problem. The current episode started more than 1 year ago. The problem is uncontrolled. Pertinent negatives include no anxiety, blurred vision, chest pain, headaches, malaise/fatigue, neck pain, orthopnea, palpitations, peripheral edema, PND, shortness of breath or sweats. Associated agents: phentermine. Past treatments include diuretics.    Review of Systems  Constitutional: Negative for chills, fever and malaise/fatigue.  HENT: Negative for drooling, ear discharge, ear pain and sore throat.   Eyes: Negative for blurred vision.  Respiratory: Negative for cough, shortness of breath and wheezing.   Cardiovascular: Negative for chest pain, palpitations, orthopnea, leg swelling and PND.  Gastrointestinal: Negative for abdominal pain, blood in stool, constipation, diarrhea and nausea.  Endocrine: Negative for polydipsia.  Genitourinary: Negative for dysuria, frequency, hematuria and urgency.  Musculoskeletal: Negative for back pain, myalgias and neck pain.  Skin: Negative for rash.  Allergic/Immunologic: Negative for environmental allergies.  Neurological: Negative for dizziness and headaches.  Hematological: Does not bruise/bleed easily.  Psychiatric/Behavioral: Negative for suicidal ideas. The patient is not nervous/anxious.     Patient Active Problem List   Diagnosis Date Noted  . Lymphedema 03/11/2019  . Diabetes ([place]) 02/25/2019  . Pain in limb 02/25/2019  . Swelling of limb 02/25/2019  . History of kidney stones 02/08/2016  . Obesity, morbid, BMI 50 or higher ([place]) 02/08/2016  . Prediabetes 02/08/2016  . Hypogonadism in male 08/22/2015  . Hypertension 12/31/2012    No Known Allergies  Past Surgical History:  Procedure  Laterality Date  . CHOLECYSTECTOMY      Social History   Tobacco Use  . Smoking status: Never Smoker  . Smokeless tobacco: Never Used  Substance Use Topics  . Alcohol use: No    Alcohol/week: 0.0 standard drinks  . Drug use: No     Medication list has been reviewed and updated.  Current Meds  Medication Sig  . albuterol (PROAIR HFA) 108 (90 Base) MCG/ACT inhaler   . B-D 3CC LUER-LOK SYR 18GX1-1/2 18G X 1-1/2" 3 ML MISC USE TO DRAW UP TESTOSTERONE FROM THE VIAL.  Marland Kitchen BD DISP NEEDLES 22G X 1-1/2" MISC USE TO ADMINISTER IM TESTOSTERONE.  Marland Kitchen clomiPHENE (CLOMID) 50 MG tablet Take 25 mg by mouth daily. Endo- Dr Honor Junes  . hydrochlorothiazide (HYDRODIURIL) 12.5 MG tablet Take 1 tablet (12.5 mg total) by mouth daily.  . phentermine (ADIPEX-P) 37.5 MG tablet Take 1 tablet by mouth daily. Dr Honor Junes    [facility] 2/9 Scores 04/12/2018 02/08/2016  PHQ - 2 Score 0 0  PHQ- 9 Score 3 -    BP Readings from Last 3 Encounters:  04/18/19 (!) 138/102  03/11/19 (!) 144/99  02/25/19 (!) 132/93    Physical Exam Vitals signs and nursing note reviewed.  HENT:     Head: Normocephalic.     Right Ear: External ear normal.     Left Ear: External ear normal.     Nose: Nose normal.  Eyes:     General: No scleral icterus.       Right eye: No discharge.        Left eye: No discharge.     Conjunctiva/sclera: Conjunctivae normal.     Pupils: Pupils are equal, round, and reactive to light.  Neck:  Musculoskeletal: Normal range of motion and neck supple.     Thyroid: No thyromegaly.     Vascular: No JVD.     Trachea: No tracheal deviation.  Cardiovascular:     Rate and Rhythm: Normal rate and regular rhythm.     Heart sounds: Normal heart sounds. No murmur. No friction rub. No gallop.   Pulmonary:     Effort: No respiratory distress.     Breath sounds: Normal breath sounds. No wheezing or rales.  Abdominal:     General: Bowel sounds are normal.     Palpations: Abdomen is soft. There is no  mass.     Tenderness: There is no abdominal tenderness. There is no guarding or rebound.  Musculoskeletal: Normal range of motion.        General: No tenderness.  Lymphadenopathy:     Cervical: No cervical adenopathy.  Skin:    General: Skin is warm.     Findings: No rash.  Neurological:     Mental Status: He is alert and oriented to person, place, and time.     Cranial Nerves: No cranial nerve deficit.     Deep Tendon Reflexes: Reflexes are normal and symmetric.     Wt Readings from Last 3 Encounters:  04/18/19 (!) 335 lb (152 kg)  03/11/19 (!) 335 lb (152 kg)  02/25/19 (!) 337 lb (152.9 kg)    BP (!) 138/102   Pulse 88   Ht 5\' 11"  (1.803 m)   Wt (!) 335 lb (152 kg)   BMI 46.72 kg/m   Assessment and Plan:                                1. Essential hypertension Chronic.  Uncontrolled.  Patient discontinued medication lisinopril, metformin, and Clomid.  Patient thought it was secondary to having a reaction while being in the sun with excessive diaphoresis.  It was explained to the patient that he needs to be on ACE/R because of his diabetes.  Patient was encouraged to continue his hydrochlorothiazide but losartan 50 mg once a day was initiated.  Patient is continuing to see Dr. Gershon Crane'Connell for his diabetic evaluation. - losartan (COZAAR) 50 MG tablet; Take 1 tablet (50 mg total) by mouth daily.  Dispense: 30 tablet; Refill: 1

## 2019-05-10 ENCOUNTER — Other Ambulatory Visit: Payer: Self-pay | Admitting: Family Medicine

## 2019-05-10 DIAGNOSIS — I10 Essential (primary) hypertension: Secondary | ICD-10-CM

## 2019-06-11 LAB — HEMOGLOBIN A1C: Hemoglobin A1C: 6.2

## 2019-06-17 ENCOUNTER — Ambulatory Visit (INDEPENDENT_AMBULATORY_CARE_PROVIDER_SITE_OTHER): Payer: 59 | Admitting: Vascular Surgery

## 2019-06-20 ENCOUNTER — Other Ambulatory Visit: Payer: Self-pay

## 2019-06-20 DIAGNOSIS — I10 Essential (primary) hypertension: Secondary | ICD-10-CM

## 2019-06-20 MED ORDER — LOSARTAN POTASSIUM 50 MG PO TABS: 50.0000 mg | ORAL_TABLET | Freq: Every day | ORAL | 0 refills | Status: DC

## 2019-07-05 ENCOUNTER — Ambulatory Visit (INDEPENDENT_AMBULATORY_CARE_PROVIDER_SITE_OTHER): Payer: 59 | Admitting: Vascular Surgery

## 2019-07-18 ENCOUNTER — Other Ambulatory Visit: Payer: Self-pay | Admitting: Family Medicine

## 2019-07-18 DIAGNOSIS — I10 Essential (primary) hypertension: Secondary | ICD-10-CM

## 2019-07-21 ENCOUNTER — Other Ambulatory Visit: Payer: Self-pay | Admitting: Family Medicine

## 2019-07-21 ENCOUNTER — Ambulatory Visit: Payer: 59 | Admitting: Family Medicine

## 2019-07-21 DIAGNOSIS — I10 Essential (primary) hypertension: Secondary | ICD-10-CM

## 2019-07-26 ENCOUNTER — Other Ambulatory Visit: Payer: Self-pay

## 2019-07-26 ENCOUNTER — Encounter (INDEPENDENT_AMBULATORY_CARE_PROVIDER_SITE_OTHER): Payer: Self-pay | Admitting: Vascular Surgery

## 2019-07-26 ENCOUNTER — Ambulatory Visit (INDEPENDENT_AMBULATORY_CARE_PROVIDER_SITE_OTHER): Payer: 59 | Admitting: Vascular Surgery

## 2019-07-26 VITALS — BP 159/96 | HR 89 | Resp 12 | Ht 71.0 in | Wt 332.0 lb

## 2019-07-26 DIAGNOSIS — E119 Type 2 diabetes mellitus without complications: Secondary | ICD-10-CM

## 2019-07-26 DIAGNOSIS — M79605 Pain in left leg: Secondary | ICD-10-CM

## 2019-07-26 DIAGNOSIS — I10 Essential (primary) hypertension: Secondary | ICD-10-CM

## 2019-07-26 DIAGNOSIS — I890 Lymphedema, not elsewhere classified: Secondary | ICD-10-CM | POA: Diagnosis not present

## 2019-07-26 DIAGNOSIS — M79604 Pain in right leg: Secondary | ICD-10-CM

## 2019-07-26 NOTE — Assessment & Plan Note (Signed)
His symptoms have been doing really well with increased activity and he has elected not to get the lymphedema pump due to the increased cost.  That is certainly reasonable.  His leg swelling is fairly mild at this point and no further therapy or intervention is planned at this point.  I will see him back on an as-needed basis for now

## 2019-07-26 NOTE — Patient Instructions (Signed)

## 2019-07-26 NOTE — Progress Notes (Signed)
MRN : [id number]  Wesley Hall is a 48 y.o. ([birth year]) male who presents with chief complaint of  Chief Complaint  Patient presents with  . Follow-up  .  History of Present Illness: Patient returns today in follow up of his lymphedema.  He has maintained good activity levels and his leg swelling has remained fairly mild.  He has elected not to get the lymphedema pump due to a significant cost beyond his insurance.  He elevates his legs as tolerated.  He has had no fevers or chills.  No signs of infection or ulceration of the lower extremities.  Current Outpatient Medications  Medication Sig Dispense Refill  . albuterol (PROAIR HFA) 108 (90 Base) MCG/ACT inhaler     . hydrochlorothiazide (HYDRODIURIL) 12.5 MG tablet Take 1 tablet (12.5 mg total) by mouth daily. 90 tablet 1  . losartan (COZAAR) 50 MG tablet TAKE 1 TABLET BY MOUTH EVERY DAY 15 tablet 0  . phentermine (ADIPEX-P) 37.5 MG tablet Take 37.5 mg by mouth daily before breakfast.    . B-D 3CC LUER-LOK SYR 18GX1-1/2 18G X 1-1/2" 3 ML MISC USE TO DRAW UP TESTOSTERONE FROM THE VIAL.  11  . BD DISP NEEDLES 22G X 1-1/2" MISC USE TO ADMINISTER IM TESTOSTERONE.  11   No current facility-administered medications for this visit.     Past Medical History:  Diagnosis Date  . Athlete's foot   . Bronchitis   . Diabetes mellitus without complication ([place])   . Hypertension   . Thyroid disease     Past Surgical History:  Procedure Laterality Date  . CHOLECYSTECTOMY     Social History        Tobacco Use  . Smoking status: Never Smoker  . Smokeless tobacco: Never Used  Substance Use Topics  . Alcohol use: No    Alcohol/week: 0.0 standard drinks  . Drug use: No    Family History No bleeding or clotting disorders  No Known Allergies   REVIEW OF SYSTEMS(Negative unless checked)  Constitutional: [] ??Weight loss[] ??Fever[] ??Chills Cardiac:[] ??Chest pain[] ??Chest pressure[] ??Palpitations  [] ??Shortness of breath when laying flat [] ??Shortness of breath at rest [] ??Shortness of breath with exertion. Vascular: [x] ??Pain in legs with walking[x] ??Pain in legsat rest[] ??Pain in legs when laying flat [] ??Claudication [] ??Pain in feet when walking [] ??Pain in feet at rest [] ??Pain in feet when laying flat [] ??History of DVT [] ??Phlebitis [x] ??Swelling in legs [] ??Varicose veins [] ??Non-healing ulcers Pulmonary: [] ??Uses home oxygen [] ??Productive cough[] ??Hemoptysis [] ??Wheeze [] ??COPD [] ??Asthma Neurologic: [] ??Dizziness [] ??Blackouts [] ??Seizures [] ??History of stroke [] ??History of TIA[] ??Aphasia [] ??Temporary blindness[] ??Dysphagia [] ??Weaknessor numbness in arms [] ??Weakness or numbnessin legs Musculoskeletal: [x] ??Arthritis [] ??Joint swelling [] ??Joint pain [] ??Low back pain Hematologic:[] ??Easy bruising[] ??Easy bleeding [] ??Hypercoagulable state [] ??Anemic [] ??Hepatitis Gastrointestinal:[] ??Blood in stool[] ??Vomiting blood[] ??Gastroesophageal reflux/heartburn[] ??Abdominal pain Genitourinary: [] ??Chronic kidney disease [] ??Difficulturination [] ??Frequenturination [] ??Burning with urination[] ??Hematuria Skin: [] ??Rashes [] ??Ulcers [] ??Wounds Psychological: [] ??History of anxiety[] ??History of major depression.    Physical Examination  BP (!) 159/96 (BP Location: Left Wrist, Patient Position: Sitting, Cuff Size: Normal)   Pulse 89   Resp 12   Ht 5\' 11"  (1.803 m)   Wt (!) 332 lb (150.6 kg)   BMI 46.30 kg/m  Gen:  WD/WN, NAD Head: [place]/AT, No temporalis wasting. Ear/Nose/Throat: Hearing grossly intact, nares w/o erythema or drainage Eyes: Conjunctiva clear. Sclera non-icteric Neck: Supple.  Trachea midline Pulmonary:  Good air movement, no use of accessory muscles.  Cardiac: RRR, no JVD Vascular:  Vessel Right Left  Radial Palpable Palpable  PT Palpable Palpable  DP Palpable Palpable   Gastrointestinal: soft, non-tender/non-distended. No guarding/reflex.  Musculoskeletal: M/S 5/5 throughout.  No deformity or atrophy.  1+ bilateral lower extremity edema. Neurologic: Sensation grossly intact in extremities.  Symmetrical.  Speech is fluent.  Psychiatric: Judgment intact, Mood & affect appropriate for pt's clinical situation. Dermatologic: No rashes or ulcers noted.  No cellulitis or open wounds.       Labs No results found for this or any previous visit (from the past 2160 hour(s)).  Radiology No results found.  Assessment/Plan Diabetes (HCC) blood glucose control important in reducing the progression of atherosclerotic disease. Also, involved in wound healing. On appropriate medications.   Hypertension blood pressure control important in reducing the progression of atherosclerotic disease. On appropriate oral medications.   Pain in limb With recurrent cellulitic episodes and chronic swelling, the patient almost certainly has developed lymphedema from scarring and lymphatic channels.  Lymphedema His symptoms have been doing really well with increased activity and he has elected not to get the lymphedema pump due to the increased cost.  That is certainly reasonable.  His leg swelling is fairly mild at this point and no further therapy or intervention is planned at this point.  I will see him back on an as-needed basis for now    Festus BarrenJason Kaisyn Millea, MD  07/26/2019 4:42 PM    This note was created with Dragon medical transcription system.  Any errors from dictation are purely unintentional

## 2019-07-28 ENCOUNTER — Other Ambulatory Visit: Payer: Self-pay | Admitting: Family Medicine

## 2019-07-28 DIAGNOSIS — I10 Essential (primary) hypertension: Secondary | ICD-10-CM

## 2019-07-29 ENCOUNTER — Other Ambulatory Visit: Payer: Self-pay

## 2019-07-29 ENCOUNTER — Ambulatory Visit: Payer: 59 | Admitting: Family Medicine

## 2019-07-29 ENCOUNTER — Encounter: Payer: Self-pay | Admitting: Family Medicine

## 2019-07-29 VITALS — BP 122/82 | HR 60 | Resp 16 | Ht 71.0 in | Wt 328.0 lb

## 2019-07-29 DIAGNOSIS — I10 Essential (primary) hypertension: Secondary | ICD-10-CM

## 2019-07-29 DIAGNOSIS — E782 Mixed hyperlipidemia: Secondary | ICD-10-CM

## 2019-07-29 DIAGNOSIS — R69 Illness, unspecified: Secondary | ICD-10-CM

## 2019-07-29 MED ORDER — LOSARTAN POTASSIUM 50 MG PO TABS: 50.0000 mg | ORAL_TABLET | Freq: Every day | ORAL | 1 refills | Status: DC

## 2019-07-29 MED ORDER — HYDROCHLOROTHIAZIDE 12.5 MG PO TABS: 12.5000 mg | ORAL_TABLET | Freq: Every day | ORAL | 1 refills | Status: DC

## 2019-07-29 NOTE — Patient Instructions (Signed)

## 2019-07-29 NOTE — Progress Notes (Signed)
Date:  07/29/2019   Name:  Wesley Hall   DOB:  July 16, 1971   MRN:  [id number]   Chief Complaint: Hypertension  Hypertension This is a chronic problem. The current episode started more than 1 year ago. The problem has been gradually improving since onset. The problem is controlled. Pertinent negatives include no anxiety, blurred vision, chest pain, headaches, malaise/fatigue, neck pain, orthopnea, palpitations, peripheral edema, PND, shortness of breath or sweats. There are no associated agents to hypertension. There are no known risk factors for coronary artery disease. Past treatments include angiotensin blockers and diuretics. The current treatment provides moderate improvement. There are no compliance problems.  There is no history of angina, kidney disease, CAD/MI, CVA, heart failure, left ventricular hypertrophy, PVD or retinopathy. There is no history of chronic renal disease, a hypertension causing med or renovascular disease.    Review of Systems  Constitutional: Negative for chills, fever and malaise/fatigue.  HENT: Negative for drooling, ear discharge, ear pain, hearing loss, mouth sores and sore throat.   Eyes: Negative for blurred vision.  Respiratory: Negative for cough, shortness of breath and wheezing.   Cardiovascular: Negative for chest pain, palpitations, orthopnea, leg swelling and PND.  Gastrointestinal: Negative for abdominal pain, blood in stool, constipation, diarrhea and nausea.  Endocrine: Negative for polydipsia.  Genitourinary: Negative for dysuria, frequency, hematuria and urgency.  Musculoskeletal: Negative for back pain, myalgias and neck pain.  Skin: Negative for rash.  Allergic/Immunologic: Negative for environmental allergies.  Neurological: Negative for dizziness and headaches.  Hematological: Does not bruise/bleed easily.  Psychiatric/Behavioral: Negative for suicidal ideas. The patient is not nervous/anxious.     Patient Active Problem List   Diagnosis Date Noted  . Lymphedema 03/11/2019  . Diabetes ([place]) 02/25/2019  . Pain in limb 02/25/2019  . Swelling of limb 02/25/2019  . History of kidney stones 02/08/2016  . Obesity, morbid, BMI 50 or higher ([place]) 02/08/2016  . Prediabetes 02/08/2016  . Hypogonadism in male 08/22/2015  . Hypertension 12/31/2012    No Known Allergies  Past Surgical History:  Procedure Laterality Date  . CHOLECYSTECTOMY      Social History   Tobacco Use  . Smoking status: Never Smoker  . Smokeless tobacco: Never Used  Substance Use Topics  . Alcohol use: No    Alcohol/week: 0.0 standard drinks  . Drug use: No     Medication list has been reviewed and updated.  Current Meds  Medication Sig  . albuterol (PROAIR HFA) 108 (90 Base) MCG/ACT inhaler   . B-D 3CC LUER-LOK SYR 18GX1-1/2 18G X 1-1/2" 3 ML MISC USE TO DRAW UP TESTOSTERONE FROM THE VIAL.  Marland Kitchen BD DISP NEEDLES 22G X 1-1/2" MISC USE TO ADMINISTER IM TESTOSTERONE.  . hydrochlorothiazide (HYDRODIURIL) 12.5 MG tablet Take 1 tablet (12.5 mg total) by mouth daily.  Marland Kitchen losartan (COZAAR) 50 MG tablet Take 1 tablet (50 mg total) by mouth daily.  . phentermine (ADIPEX-P) 37.5 MG tablet Take 37.5 mg by mouth daily before breakfast.  . [DISCONTINUED] hydrochlorothiazide (HYDRODIURIL) 12.5 MG tablet Take 1 tablet (12.5 mg total) by mouth daily.  . [DISCONTINUED] losartan (COZAAR) 50 MG tablet TAKE 1 TABLET BY MOUTH EVERY DAY    PHQ 2/9 Scores 07/29/2019 04/12/2018 02/08/2016  PHQ - 2 Score 0 0 0  PHQ- 9 Score 0 3 -    BP Readings from Last 3 Encounters:  07/29/19 122/82  07/26/19 (!) 159/96  04/18/19 (!) 138/102    Physical Exam Vitals signs and nursing  note reviewed.  HENT:     Head: Normocephalic.     Right Ear: Tympanic membrane, ear canal and external ear normal.     Left Ear: Tympanic membrane, ear canal and external ear normal.     Nose: Nose normal.  Eyes:     General: No scleral icterus.       Right eye: No discharge.         Left eye: No discharge.     Conjunctiva/sclera: Conjunctivae normal.     Pupils: Pupils are equal, round, and reactive to light.  Neck:     Musculoskeletal: Normal range of motion and neck supple.     Thyroid: No thyromegaly.     Vascular: No JVD.     Trachea: No tracheal deviation.  Cardiovascular:     Rate and Rhythm: Normal rate and regular rhythm.     Heart sounds: Normal heart sounds. No murmur. No friction rub. No gallop.   Pulmonary:     Effort: No respiratory distress.     Breath sounds: Normal breath sounds. No wheezing or rales.  Abdominal:     General: Bowel sounds are normal.     Palpations: Abdomen is soft. There is no hepatomegaly, splenomegaly or mass.     Tenderness: There is no abdominal tenderness. There is no guarding or rebound.  Musculoskeletal: Normal range of motion.        General: No tenderness.  Lymphadenopathy:     Cervical: No cervical adenopathy.  Skin:    General: Skin is warm.     Findings: No rash.  Neurological:     Mental Status: He is alert and oriented to person, place, and time.     Cranial Nerves: No cranial nerve deficit.     Deep Tendon Reflexes: Reflexes are normal and symmetric.     Wt Readings from Last 3 Encounters:  07/29/19 (!) 328 lb (148.8 kg)  07/26/19 (!) 332 lb (150.6 kg)  04/18/19 (!) 335 lb (152 kg)    BP 122/82   Pulse 60   Resp 16   Ht 5\' 11"  (1.803 m)   Wt (!) 328 lb (148.8 kg)   BMI 45.75 kg/m   Assessment and Plan:  1. Essential hypertension Chronic.  Controlled.  Continue hydrochlorothiazide 12.5 mg and losartan 50 mg once a day.  Will check renal function panel. - hydrochlorothiazide (HYDRODIURIL) 12.5 MG tablet; Take 1 tablet (12.5 mg total) by mouth daily.  Dispense: 90 tablet; Refill: 1 - losartan (COZAAR) 50 MG tablet; Take 1 tablet (50 mg total) by mouth daily.  Dispense: 90 tablet; Refill: 1 - Renal Function Panel - Hepatic Function Panel (6) - Lipid panel  2. Moderate mixed hyperlipidemia not  requiring statin therapy Chronic.  Controlled.  Noted on previous labs that there was some elevation of the triglycerides and decrease in HDL.  Will check lipid panel.  Patient is diabetic and it has not been necessary in the past to consider a statin agent. - Lipid panel  3. Taking medication for chronic disease Patient was told that he might be able some suggestion of steatosis from his description.  Will check a hepatic function panel as requested by patient - Hepatic Function Panel (6)

## 2019-07-30 LAB — RENAL FUNCTION PANEL
Albumin: 4.5 g/dL (ref 4.0–5.0)
BUN/Creatinine Ratio: 12 (ref 9–20)
BUN: 14 mg/dL (ref 6–24)
CO2: 23 mmol/L (ref 20–29)
Calcium: 9.3 mg/dL (ref 8.7–10.2)
Chloride: 101 mmol/L (ref 96–106)
Creatinine, Ser: 1.15 mg/dL (ref 0.76–1.27)
GFR calc Af Amer: 85 mL/min/{1.73_m2} (ref >59)
GFR calc non Af Amer: 74 mL/min/{1.73_m2} (ref >59)
Glucose: 115 mg/dL — ABNORMAL HIGH (ref 65–99)
Phosphorus: 3.3 mg/dL (ref 2.8–4.1)
Potassium: 4.6 mmol/L (ref 3.5–5.2)
Sodium: 137 mmol/L (ref 134–144)

## 2019-07-30 LAB — HEPATIC FUNCTION PANEL (6)
ALT: 30 IU/L (ref 0–44)
AST: 23 IU/L (ref 0–40)
Alkaline Phosphatase: 73 IU/L (ref 39–117)
Bilirubin Total: 0.9 mg/dL (ref 0.0–1.2)
Bilirubin, Direct: 0.25 mg/dL (ref 0.00–0.40)

## 2019-07-30 LAB — LIPID PANEL
Chol/HDL Ratio: 3.8 ratio (ref 0.0–5.0)
Cholesterol, Total: 166 mg/dL (ref 100–199)
HDL: 44 mg/dL (ref >39)
LDL Chol Calc (NIH): 105 mg/dL — ABNORMAL HIGH (ref 0–99)
Triglycerides: 92 mg/dL (ref 0–149)
VLDL Cholesterol Cal: 17 mg/dL (ref 5–40)

## 2020-01-25 ENCOUNTER — Other Ambulatory Visit: Payer: Self-pay | Admitting: Family Medicine

## 2020-01-25 DIAGNOSIS — I10 Essential (primary) hypertension: Secondary | ICD-10-CM

## 2020-01-26 ENCOUNTER — Ambulatory Visit: Payer: 59 | Admitting: Family Medicine

## 2020-01-27 ENCOUNTER — Ambulatory Visit: Payer: 59 | Admitting: Family Medicine

## 2020-02-03 ENCOUNTER — Other Ambulatory Visit: Payer: Self-pay | Admitting: Neurology

## 2020-02-03 ENCOUNTER — Other Ambulatory Visit: Payer: Self-pay | Admitting: Nurse Practitioner

## 2020-02-03 DIAGNOSIS — R1011 Right upper quadrant pain: Secondary | ICD-10-CM

## 2020-02-03 DIAGNOSIS — Z8719 Personal history of other diseases of the digestive system: Secondary | ICD-10-CM

## 2020-02-03 DIAGNOSIS — R7303 Prediabetes: Secondary | ICD-10-CM

## 2020-02-06 ENCOUNTER — Ambulatory Visit: Payer: 59 | Admitting: Family Medicine

## 2020-02-09 ENCOUNTER — Ambulatory Visit: Payer: 59

## 2020-02-18 ENCOUNTER — Encounter: Payer: Self-pay | Admitting: Nurse Practitioner

## 2020-03-15 DIAGNOSIS — K760 Fatty (change of) liver, not elsewhere classified: Secondary | ICD-10-CM | POA: Insufficient documentation

## 2020-03-15 DIAGNOSIS — N200 Calculus of kidney: Secondary | ICD-10-CM | POA: Insufficient documentation

## 2020-03-15 DIAGNOSIS — B353 Tinea pedis: Secondary | ICD-10-CM | POA: Insufficient documentation

## 2020-04-12 ENCOUNTER — Other Ambulatory Visit: Payer: Self-pay | Admitting: Family Medicine

## 2020-04-12 ENCOUNTER — Other Ambulatory Visit: Payer: Self-pay

## 2020-04-12 ENCOUNTER — Other Ambulatory Visit (HOSPITAL_COMMUNITY): Payer: Self-pay | Admitting: Family Medicine

## 2020-04-12 ENCOUNTER — Ambulatory Visit
Admission: RE | Admit: 2020-04-12 | Discharge: 2020-04-12 | Disposition: A | Discharge status: Home / Self Care | Payer: No Typology Code available for payment source | Source: Ambulatory Visit | Attending: Family Medicine | Admitting: Family Medicine

## 2020-04-12 DIAGNOSIS — M79605 Pain in left leg: Secondary | ICD-10-CM

## 2020-04-12 DIAGNOSIS — M7989 Other specified soft tissue disorders: Secondary | ICD-10-CM

## 2020-06-14 DIAGNOSIS — I493 Ventricular premature depolarization: Secondary | ICD-10-CM | POA: Insufficient documentation

## 2020-06-14 DIAGNOSIS — G4733 Obstructive sleep apnea (adult) (pediatric): Secondary | ICD-10-CM | POA: Insufficient documentation

## 2020-09-26 ENCOUNTER — Telehealth (INDEPENDENT_AMBULATORY_CARE_PROVIDER_SITE_OTHER): Payer: Self-pay | Admitting: Gastroenterology

## 2020-09-26 DIAGNOSIS — Z1211 Encounter for screening for malignant neoplasm of colon: Secondary | ICD-10-CM

## 2020-09-26 NOTE — Progress Notes (Signed)
Gastroenterology Pre-Procedure Review  Request Date: Patient will call back due to COVID Testing he will need to check his work schedule Requesting Physician: Dr.   PATIENT REVIEW QUESTIONS: The patient responded to the following health history questions as indicated:    1. Are you having any GI issues? no 2. Do you have a personal history of Polyps? no 3. Do you have a family history of Colon Cancer or Polyps? no 4. Diabetes Mellitus? Yes noted on problem list 5. Joint replacements in the past 12 months?no 6. Major health problems in the past 3 months?no 7. Any artificial heart valves, MVP, or defibrillator?no    MEDICATIONS & ALLERGIES:    Patient reports the following regarding taking any anticoagulation/antiplatelet therapy:   Plavix, Coumadin, Eliquis, Xarelto, Lovenox, Pradaxa, Brilinta, or Effient? no Aspirin? no  Patient confirms/reports the following medications:  Current Outpatient Medications  Medication Sig Dispense Refill  . hydrochlorothiazide (HYDRODIURIL) 12.5 MG tablet TAKE 1 TABLET BY MOUTH EVERY DAY 30 tablet 0  . losartan (COZAAR) 50 MG tablet TAKE 1 TABLET BY MOUTH EVERY DAY 90 tablet 1  . WEGOVY 0.25 MG/0.5ML SOAJ     . albuterol (PROAIR HFA) 108 (90 Base) MCG/ACT inhaler  (Patient not taking: Reported on 09/26/2020)    . B-D 3CC LUER-LOK SYR 18GX1-1/2 18G X 1-1/2" 3 ML MISC USE TO DRAW UP TESTOSTERONE FROM THE VIAL. (Patient not taking: Reported on 09/26/2020)  11  . BD DISP NEEDLES 22G X 1-1/2" MISC USE TO ADMINISTER IM TESTOSTERONE. (Patient not taking: Reported on 09/26/2020)  11  . clotrimazole-betamethasone (LOTRISONE) cream APPLY TO RASH EVERY DAY IF FLARING (Patient not taking: Reported on 09/26/2020)    . ketoconazole (NIZORAL) 2 % shampoo Apply topically. (Patient not taking: Reported on 09/26/2020)    . ketoconazole (NIZORAL) 2 % shampoo Apply topically. (Patient not taking: Reported on 09/26/2020)    . losartan (COZAAR) 25 MG tablet Take 25 mg by  mouth daily. (Patient not taking: Reported on 09/26/2020)    . meloxicam (MOBIC) 15 MG tablet Take by mouth. (Patient not taking: Reported on 09/26/2020)    . meloxicam (MOBIC) 15 MG tablet Take 15 mg by mouth daily. (Patient not taking: Reported on 09/26/2020)    . phentermine (ADIPEX-P) 37.5 MG tablet Take 37.5 mg by mouth daily before breakfast. (Patient not taking: Reported on 09/26/2020)     No current facility-administered medications for this visit.    Patient confirms/reports the following allergies:  No Known Allergies  No orders of the defined types were placed in this encounter.   AUTHORIZATION INFORMATION Primary Insurance: 1D#: Group #:  Secondary Insurance: 1D#: Group #:  SCHEDULE INFORMATION: Date: Patient will call back due to COVID Testing he will need to check his schedule Time: Location:[facility]

## 2021-12-24 ENCOUNTER — Ambulatory Visit: Payer: Self-pay | Admitting: Family Medicine

## 2022-01-23 ENCOUNTER — Other Ambulatory Visit: Payer: Self-pay

## 2022-01-23 ENCOUNTER — Emergency Department
Admission: EM | Admit: 2022-01-23 | Discharge: 2022-01-23 | Disposition: A | Discharge status: Home / Self Care | Payer: No Typology Code available for payment source | Attending: Emergency Medicine | Admitting: Emergency Medicine

## 2022-01-23 ENCOUNTER — Emergency Department: Payer: No Typology Code available for payment source

## 2022-01-23 DIAGNOSIS — L03116 Cellulitis of left lower limb: Secondary | ICD-10-CM | POA: Insufficient documentation

## 2022-01-23 LAB — CBC WITH DIFFERENTIAL/PLATELET
Abs Immature Granulocytes: 0.02 10*3/uL (ref 0.00–0.07)
Basophils Absolute: 0 10*3/uL (ref 0.0–0.1)
Basophils Relative: 1 %
Eosinophils Absolute: 0.4 10*3/uL (ref 0.0–0.5)
Eosinophils Relative: 4 %
HCT: 45.7 % (ref 39.0–52.0)
Hemoglobin: 15.6 g/dL (ref 13.0–17.0)
Immature Granulocytes: 0 %
Lymphocytes Relative: 19 %
Lymphs Abs: 1.5 10*3/uL (ref 0.7–4.0)
MCH: 29.7 pg (ref 26.0–34.0)
MCHC: 34.1 g/dL (ref 30.0–36.0)
MCV: 86.9 fL (ref 80.0–100.0)
Monocytes Absolute: 0.7 10*3/uL (ref 0.1–1.0)
Monocytes Relative: 8 %
Neutro Abs: 5.3 10*3/uL (ref 1.7–7.7)
Neutrophils Relative %: 68 %
Platelets: 257 10*3/uL (ref 150–400)
RBC: 5.26 MIL/uL (ref 4.22–5.81)
RDW: 12.7 % (ref 11.5–15.5)
WBC: 7.9 10*3/uL (ref 4.0–10.5)
nRBC: 0 % (ref 0.0–0.2)

## 2022-01-23 LAB — COMPREHENSIVE METABOLIC PANEL
ALT: 28 U/L (ref 0–44)
AST: 24 U/L (ref 15–41)
Albumin: 4.4 g/dL (ref 3.5–5.0)
Alkaline Phosphatase: 66 U/L (ref 38–126)
Anion gap: 12 (ref 5–15)
BUN: 12 mg/dL (ref 6–20)
CO2: 22 mmol/L (ref 22–32)
Calcium: 9.5 mg/dL (ref 8.9–10.3)
Chloride: 100 mmol/L (ref 98–111)
Creatinine, Ser: 1.58 mg/dL — ABNORMAL HIGH (ref 0.61–1.24)
GFR, Estimated: 52 mL/min — ABNORMAL LOW (ref >60)
Glucose, Bld: 101 mg/dL — ABNORMAL HIGH (ref 70–99)
Potassium: 4.2 mmol/L (ref 3.5–5.1)
Sodium: 134 mmol/L — ABNORMAL LOW (ref 135–145)
Total Bilirubin: 1.4 mg/dL — ABNORMAL HIGH (ref 0.3–1.2)
Total Protein: 8 g/dL (ref 6.5–8.1)

## 2022-01-23 LAB — LACTIC ACID, PLASMA: Lactic Acid, Venous: 0.9 mmol/L (ref 0.5–1.9)

## 2022-01-23 MED ORDER — CEPHALEXIN 500 MG PO CAPS: 500.0000 mg | ORAL_CAPSULE | Freq: Four times a day (QID) | ORAL | 0 refills | Status: DC

## 2022-01-23 MED ORDER — CEPHALEXIN 500 MG PO CAPS
500.0000 mg | ORAL_CAPSULE | Freq: Once | ORAL | Status: AC
  Administered 2022-01-23: 500 mg via ORAL
  Filled 2022-01-23: qty 1

## 2022-01-23 MED ORDER — CEPHALEXIN 500 MG PO CAPS: 500.0000 mg | ORAL_CAPSULE | Freq: Four times a day (QID) | ORAL | 0 refills | Status: AC

## 2022-01-23 NOTE — ED Notes (Signed)
X1 blood cultures sent with labs.  ?

## 2022-01-23 NOTE — Discharge Instructions (Signed)
Please seek medical attention for any high fevers, chest pain, shortness of breath, change in behavior, persistent vomiting, bloody stool or any other new or concerning symptoms.  

## 2022-01-23 NOTE — ED Provider Notes (Signed)
? ?[facility] ?Provider Note ? ? ? None  ?  (approximate) ? ? ?History  ? ?Wound Infection ? ? ?HPI ? ?Wesley Hall is a 51 y.o. male  who presents to the emergency department today because of concern for worsening wound to his left shin. He says that it occurred when he was moving steel dog crates and one hit his shin. Went to his PCP who updated his tetanus and started him on bactrim. He has now been on bactrim for 2 days and the wound has gotten worse. He denies any fevers, nausea or vomiting.   ? ? ?Physical Exam  ? ?Triage Vital Signs: ?ED Triage Vitals  ?Enc Vitals Group  ?   BP 01/23/22 1830 118/83  ?   Pulse Rate 01/23/22 1830 65  ?   Resp 01/23/22 1830 19  ?   Temp 01/23/22 1830 97.7 ?F (36.5 ?C)  ?   Temp Source 01/23/22 1830 Oral  ?   SpO2 01/23/22 1830 95 %  ?   Weight --   ?   Height 01/23/22 1833 5\' 11"  (1.803 m)  ?   Head Circumference --   ?   Peak Flow --   ?   Pain Score 01/23/22 1833 5  ? ?Most recent vital signs: ?Vitals:  ? 01/23/22 1830  ?BP: 118/83  ?Pulse: 65  ?Resp: 19  ?Temp: 97.7 ?F (36.5 ?C)  ?SpO2: 95%  ? ? ?General: Awake, no distress.  ?CV:  Good peripheral perfusion.  ?Resp:  Normal effort.  ?Abd:  No distention.  ?Skin:  Two wounds to left shin with surrounding erythema.  ? ? ?ED Results / Procedures / Treatments  ? ?Labs ?(all labs ordered are listed, but only abnormal results are displayed) ?Labs Reviewed  ?COMPREHENSIVE METABOLIC PANEL - Abnormal; Notable for the following components:  ?    Result Value  ? Sodium 134 (*)   ? Glucose, Bld 101 (*)   ? Creatinine, Ser 1.58 (*)   ? Total Bilirubin 1.4 (*)   ? GFR, Estimated 52 (*)   ? All other components within normal limits  ?LACTIC ACID, PLASMA  ?CBC WITH DIFFERENTIAL/PLATELET  ?LACTIC ACID, PLASMA  ?URINALYSIS, ROUTINE W REFLEX MICROSCOPIC  ? ? ? ?EKG ? ?None ? ? ?RADIOLOGY ?I independently interpreted and visualized the left tib fib x-ray. My interpretation: No acute osseous abnormality ?Radiology  interpretation:  ?IMPRESSION:  ?Mild to moderate severity focal cellulitis along the anterior aspect  ?of the mid to distal left tibia.  ?   ? ? ?PROCEDURES: ? ?Critical Care performed: No ? ?Procedures ? ? ?MEDICATIONS ORDERED IN ED: ?Medications - No data to display ? ? ?IMPRESSION / MDM / ASSESSMENT AND PLAN / ED COURSE  ?I reviewed the triage vital signs and the nursing notes. ?             ?               ? ?Differential diagnosis includes, but is not limited to, cellulitis, abscess, osteomyelitis. ? ?Patient presents to the emergency department today because of concerns for worsening wound to the left shin.  On exam he does have 2 small wounds to the left shin with surrounding erythema.  The patient does not have any fluctuance to the area.  X-ray was obtained which did not show any concerns for osteomyelitis.  Patient denies history of diabetes.  I did offer admission for IV antibiotics given failure of oral antibiotics.  Patient however was reluctant to be admitted.  He would like to try different oral antibiotic.  Think this is reasonable.  No lactic acidosis or leukocytosis in the blood work.  Patient is afebrile without systemic signs of infection.  Will give patient prescription for a different oral antibiotic.  Additionally will give patient wound care center follow-up information. ? ? ?FINAL CLINICAL IMPRESSION(S) / ED DIAGNOSES  ? ?Final diagnoses:  ?Cellulitis of left lower extremity  ? ? ? ?Rx / DC Orders  ? ?ED Discharge Orders   ? ?      Ordered  ?  cephALEXin (KEFLEX) 500 MG capsule  4 times daily       ? 01/23/22 2022  ? ?  ?  ? ?  ? ? ? ?Note:  This document was prepared using Dragon voice recognition software and may include unintentional dictation errors. ? ?  ?Phineas Semen, MD ?01/23/22 2027 ? ?

## 2022-01-23 NOTE — ED Triage Notes (Addendum)
Patient to ER via POV with possible wound infection. Reports he was moving metal dog crates for a cat rescue on Thursday 3/9 and cut his left shin on the crate. Wound has worsening redness/ oozing.  ? ?Non-blanching purple-black area around large wound, erythema has expanded beyond the line that pcp drew around cellultis area.  ? ?Patient was seen by pcp on 3/13 and had an updated tdap and started on bactrim DS 2 tabs BID for 7 days.  ?

## 2022-01-28 ENCOUNTER — Ambulatory Visit: Payer: Self-pay | Admitting: Family Medicine

## 2022-01-28 ENCOUNTER — Other Ambulatory Visit: Payer: Self-pay

## 2022-01-28 ENCOUNTER — Encounter: Payer: Self-pay | Admitting: Family Medicine

## 2022-01-28 DIAGNOSIS — Z113 Encounter for screening for infections with a predominantly sexual mode of transmission: Secondary | ICD-10-CM

## 2022-01-28 DIAGNOSIS — A539 Syphilis, unspecified: Secondary | ICD-10-CM

## 2022-01-28 MED ORDER — PENICILLIN G BENZATHINE 1200000 UNIT/2ML IM SUSY
2.4000 10*6.[IU] | PREFILLED_SYRINGE | INTRAMUSCULAR | Status: AC
  Administered 2022-01-28 – 2022-02-11 (×3): 2.4 10*6.[IU] via INTRAMUSCULAR

## 2022-01-28 NOTE — Progress Notes (Signed)
Pt here for treatment of Syphilis.  Bicillin 2.4 MU given IM without any complications.  Pt informed to make an appointment for 3/28 and 4/4, for additional treatments.  Berdie Ogren, RN ? ?

## 2022-01-28 NOTE — Progress Notes (Signed)
[facility] Department ?STI clinic/screening visit ? ?Subjective:  ?SUHAS ESTIS is a 51 y.o. male being seen today for an STI screening visit. The patient reports they do not have symptoms.   ? ?Patient has the following medical conditions:   ?Patient Active Problem List  ? Diagnosis Date Noted  ? OSA (obstructive sleep apnea) 06/14/2020  ? PVC (premature ventricular contraction) 06/14/2020  ? Fatty liver 03/15/2020  ? Nephrolithiasis 03/15/2020  ? Tinea pedis of both feet 03/15/2020  ? Lymphedema 03/11/2019  ? Diabetes ([facility]) 02/25/2019  ? Pain in limb 02/25/2019  ? Swelling of limb 02/25/2019  ? History of kidney stones 02/08/2016  ? Obesity, morbid, BMI 50 or higher ([facility]) 02/08/2016  ? Prediabetes 02/08/2016  ? Hypogonadism in male 08/22/2015  ? Hypotestosteronism 08/22/2015  ? Hypertension 12/31/2012  ? ? ? ?Chief Complaint  ?Patient presents with  ? SEXUALLY TRANSMITTED DISEASE  ? ? ?HPI ? ?Patient reports he is here today for syphilis treatment.  He states that he teasted positive for syphilis 12/19/2021.  He was evaluated by [facility] ID clinic d/t  undetermined how long he has had syphilis.  His CSF was normal .  He was referred for syphilis treatment.  His PCP referred him to [facility] for treatment.  States that he has not contacted the partner he was with about 8 months ago for her to be evaluated.  States this was a one time encounter.  He and his wife aren't sexually active.  He denies known syphilis symptoms- lesions/rash, visual changes, etc. ? ?Does the patient or their partner desires a pregnancy in the next year? No ? ?Screening for MPX risk: ?Does the patient have an unexplained rash? No ?Is the patient MSM? No ?Does the patient endorse multiple sex partners or anonymous sex partners? No ?Did the patient have close or sexual contact with a person diagnosed with MPX? No ?Has the patient traveled outside the [place] where MPX is endemic? No ?Is there a high clinical suspicion for MPX-- evidenced by  one of the following No ? -Unlikely to be chickenpox ? -Lymphadenopathy ? -Rash that present in same phase of evolution on any given body part ? ? ?See flowsheet for further details and programmatic requirements.  ? ? ?The following portions of the patient's history were reviewed and updated as appropriate: allergies, current medications, past medical history, past social history, past surgical history and problem list. ? ?Objective:  ?There were no vitals filed for this visit. ? ?Physical Exam ?Constitutional:   ?   Appearance: Normal appearance.  ?HENT:  ?   Head: Normocephalic and atraumatic.  ?   Comments: No nits or hair loss ?   Mouth/Throat:  ?   Mouth: Mucous membranes are moist.  ?   Pharynx: Oropharynx is clear. No oropharyngeal exudate or posterior oropharyngeal erythema.  ?Pulmonary:  ?   Effort: Pulmonary effort is normal.  ?Abdominal:  ?   General: Abdomen is flat.  ?   Palpations: Abdomen is soft. There is no hepatomegaly or mass.  ?   Tenderness: There is no abdominal tenderness.  ?Genitourinary: ?   Pubic Area: No rash or pubic lice.   ?   Penis: Normal.   ?   Testes: Normal.  ?   Epididymis:  ?   Right: Normal.  ?   Left: Normal.  ?   Rectum: Normal.  ?Lymphadenopathy:  ?   Head:  ?   Right side of head: No preauricular or posterior  auricular adenopathy.  ?   Left side of head: No preauricular or posterior auricular adenopathy.  ?   Cervical: No cervical adenopathy.  ?   Upper Body:  ?   Right upper body: No supraclavicular or axillary adenopathy.  ?   Left upper body: No supraclavicular or axillary adenopathy.  ?   Lower Body: No right inguinal adenopathy. No left inguinal adenopathy.  ?Skin: ?   General: Skin is warm and dry.  ?   Findings: No rash.  ?Neurological:  ?   Mental Status: He is alert and oriented to person, place, and time.  ? ? ? ? ?Assessment and Plan:  ?ORLANDA LEMMERMAN is a 51 y.o. male presenting to the [facility] Department for STI screening ? ?1. Screening  examination for venereal disease ? ?- Syphilis Serology, [facility] ? ?2. Syphilis ? ?- penicillin g benzathine (BICILLIN LA) 1200000 UNIT/2ML injection 2.4 Million Units weekly x 3.  ? ?- Contacted [facility] re: treatment plan for client.  Recommended another RPR- (last RPR done about 6 wks) and to treat with Bicillin 2.4 MU x 3. ?_ Co no sexual activity until completes all treatment.  Encouraged to use condoms with sexual activity. ? ? ?No follow-ups on file. ? ?No future appointments. ? ?Larene Pickett, FNP ?

## 2022-02-04 ENCOUNTER — Ambulatory Visit: Payer: Self-pay | Admitting: Family Medicine

## 2022-02-04 ENCOUNTER — Other Ambulatory Visit: Payer: Self-pay

## 2022-02-04 ENCOUNTER — Ambulatory Visit: Payer: Self-pay

## 2022-02-04 DIAGNOSIS — A539 Syphilis, unspecified: Secondary | ICD-10-CM

## 2022-02-04 NOTE — Progress Notes (Signed)
Patient here for tx of syphilis. This is the 2nd set of penicillin shots. Patient refused to stay for 15 minutes post injections. Patient instructed to call 911 if he begins to have an allergic reaction.  ?

## 2022-02-04 NOTE — Progress Notes (Signed)
See nurse's note.  Nurse consulted me about client's and not wanting to stay for observation. ?

## 2022-02-11 ENCOUNTER — Ambulatory Visit: Payer: Self-pay | Admitting: Family Medicine

## 2022-02-11 DIAGNOSIS — A539 Syphilis, unspecified: Secondary | ICD-10-CM

## 2022-02-11 NOTE — Progress Notes (Signed)
Pt here for his 3rd treatment of Syphilis.  2.4 MU given IM without any complications.  Pt informed to return in 6 months for labs.  Pt was not seen by a Provider.  Berdie Ogren, RN ? ?

## 2022-02-12 NOTE — Progress Notes (Signed)
This patient was not seen by provider, here for syphilis treatment.  ? ? ?Wendi Snipes, FNP ? ?

## 2022-09-09 ENCOUNTER — Ambulatory Visit: Payer: Self-pay | Admitting: Nurse Practitioner

## 2022-09-09 DIAGNOSIS — E079 Disorder of thyroid, unspecified: Secondary | ICD-10-CM | POA: Insufficient documentation

## 2022-09-09 DIAGNOSIS — Z113 Encounter for screening for infections with a predominantly sexual mode of transmission: Secondary | ICD-10-CM

## 2022-09-09 NOTE — Progress Notes (Signed)
[facility] Department STI clinic/screening visit  Subjective:  Wesley Hall is a 51 y.o. male being seen today for an STI screening visit. The patient reports they do not have symptoms.    Patient has the following medical conditions:   Patient Active Problem List   Diagnosis Date Noted   Thyroid disease 09/09/2022   OSA (obstructive sleep apnea) 06/14/2020   PVC (premature ventricular contraction) 06/14/2020   Fatty liver 03/15/2020   Nephrolithiasis 03/15/2020   Tinea pedis of both feet 03/15/2020   Lymphedema 03/11/2019   Diabetes ([place]) 02/25/2019   Pain in limb 02/25/2019   Swelling of limb 02/25/2019   History of kidney stones 02/08/2016   Obesity, morbid, BMI 50 or higher ([place]) 02/08/2016   Prediabetes 02/08/2016   Hypogonadism in male 08/22/2015   Hypotestosteronism 08/22/2015   Hypertension 12/31/2012     Chief Complaint  Patient presents with   SEXUALLY TRANSMITTED DISEASE    6 months post treatment still showing traces of syphilis on blood test.     HPI  Patient reports to clinic today for RPR follow up. Patient had an RPR on 01/2022.   RPR 1:8, received Bicillin 2.4 million  units x 3 doses.  Was advised to have follow up in 6 months.  Went to [facility] for RPR follow up 08/2022, RPR 1:2.    Last HIV test per patient/review of record was: couple years ago.   Does the patient or their partner desires a pregnancy in the next year? No  Screening for MPX risk: Does the patient have an unexplained rash? No Is the patient MSM? No Does the patient endorse multiple sex partners or anonymous sex partners? No Did the patient have close or sexual contact with a person diagnosed with MPX? No Has the patient traveled outside the [place] where MPX is endemic? No Is there a high clinical suspicion for MPX-- evidenced by one of the following No  -Unlikely to be chickenpox  -Lymphadenopathy  -Rash that present in same phase of evolution on any given body  part   See flowsheet for further details and programmatic requirements.   Immunization History  Administered Date(s) Administered   Influenza-Unspecified 10/06/2015   Tdap 04/20/2016     The following portions of the patient's history were reviewed and updated as appropriate: allergies, current medications, past medical history, past social history, past surgical history and problem list.  Objective:  There were no vitals filed for this visit.  Physical Exam Constitutional:      Appearance: Normal appearance.  HENT:     Head: Normocephalic. No abrasion, masses or laceration. Hair is normal.     Right Ear: External ear normal.     Left Ear: External ear normal.     Nose: Nose normal.     Mouth/Throat:     Lips: Pink.     Mouth: Mucous membranes are moist. No oral lesions.     Pharynx: No pharyngeal swelling, oropharyngeal exudate, posterior oropharyngeal erythema or uvula swelling.     Tonsils: No tonsillar exudate or tonsillar abscesses.  Eyes:     General: Lids are normal.        Right eye: No discharge.        Left eye: No discharge.     Conjunctiva/sclera: Conjunctivae normal.     Right eye: No exudate.    Left eye: No exudate. Genitourinary:    Comments: Deferred genital exam Musculoskeletal:     Cervical back: Full passive range  of motion without pain, normal range of motion and neck supple.  Lymphadenopathy:     Upper Body:     Right upper body: No supraclavicular, axillary or epitrochlear adenopathy.     Left upper body: No supraclavicular, axillary or epitrochlear adenopathy.  Skin:    General: Skin is warm and dry.     Findings: No lesion or rash.  Neurological:     Mental Status: He is alert and oriented to person, place, and time.  Psychiatric:        Attention and Perception: Attention normal.        Mood and Affect: Mood normal.        Speech: Speech normal.        Behavior: Behavior normal. Behavior is cooperative.       Assessment and Plan:   Wesley Hall is a 51 y.o. male presenting to the [facility] Department for STI screening  1. Screening examination for venereal disease -50 year old male in clinic today for RPR follow up.  Due to having titers drawn on 08/19/22 at [facility]. Patient will not need additional screenings today.  RPR on 08/19/22 was a 1:2 titers declining since 01/2022, denies signs and symptoms and recent exposures.  Informed to follow up in 6 months (02/2023).    -Declines all other screenings today.   Total time spent: 20 minutes   Return in about 6 months (around 03/10/2023).   Glenna Fellows, FNP

## 2022-09-09 NOTE — Progress Notes (Signed)
Pt appointment to discuss recent Syphilis results. Seen by FNP White.

## 2024-02-09 IMAGING — CR DG TIBIA/FIBULA 2V*L*
1 series · 3 of 3 positions shown · non-contrast
Comparison: None.

CLINICAL DATA: Possible wound infection.

EXAM:
LEFT TIBIA AND FIBULA - 2 VIEW

[Series 1: dg tibia/fibula left · 0.14mm/px · 3 of 3 slices shown]
[im 1/3]
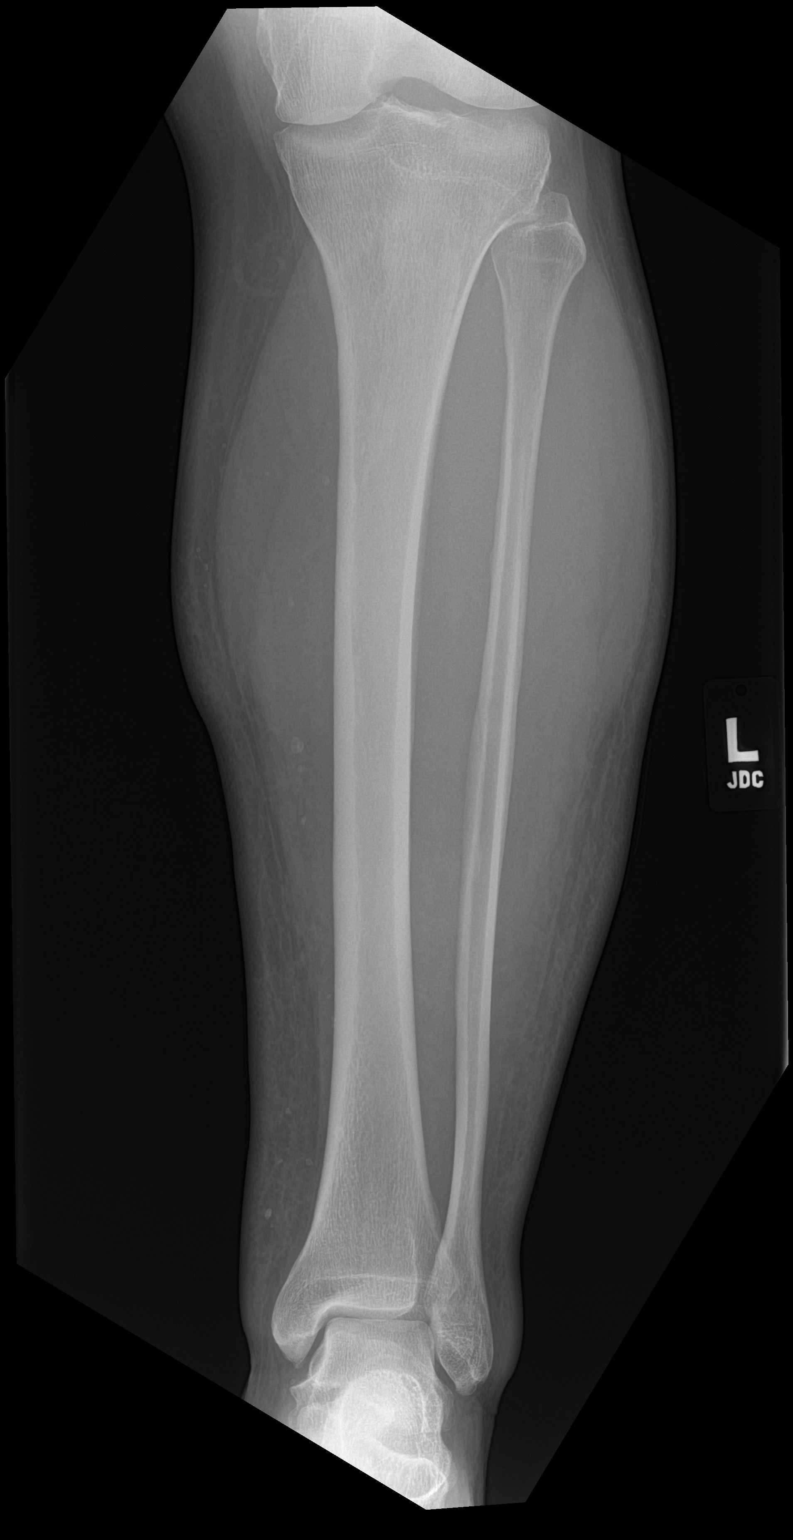
[im 2/3]
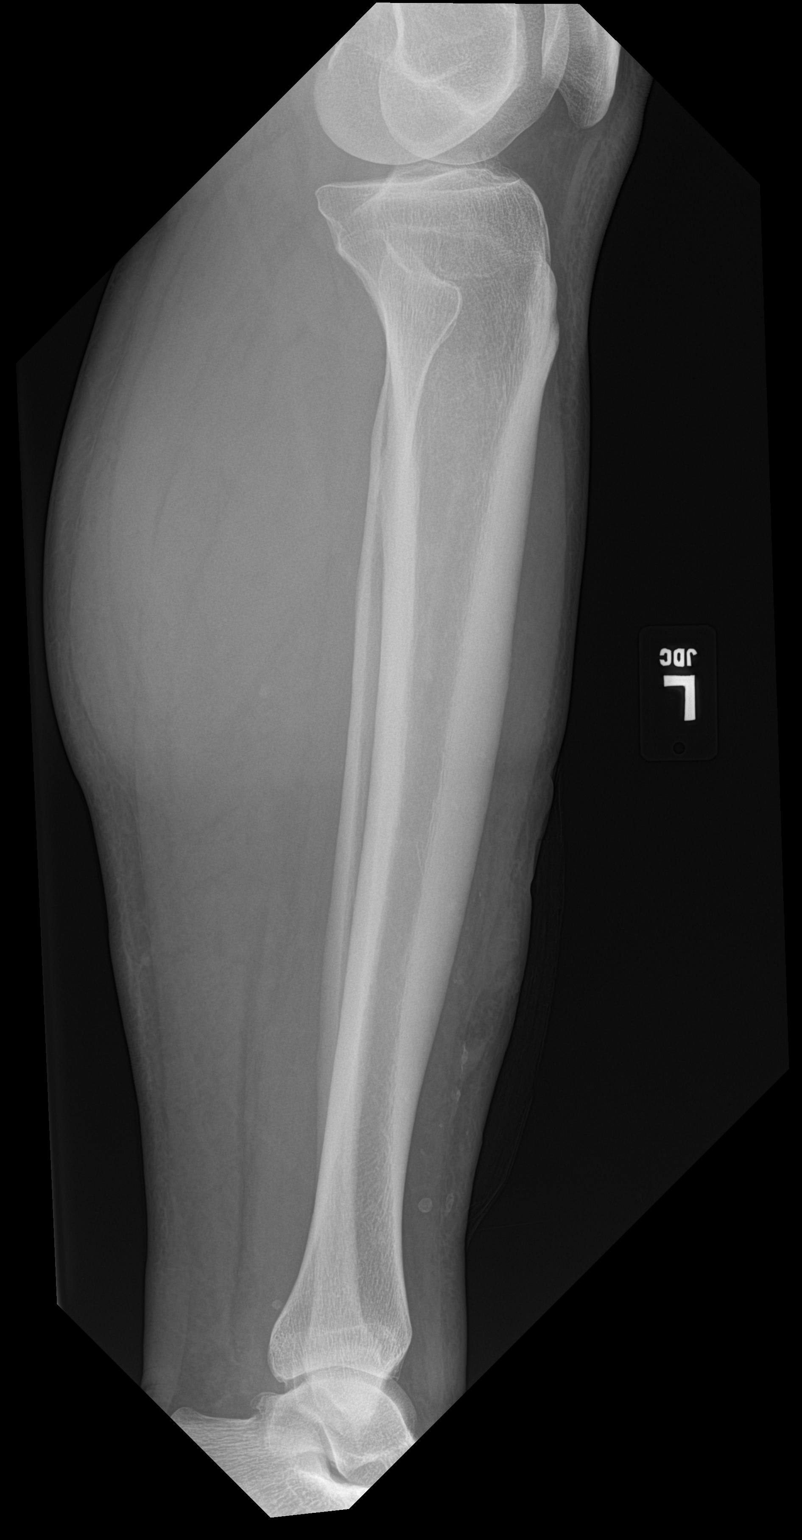
[im 3/3]
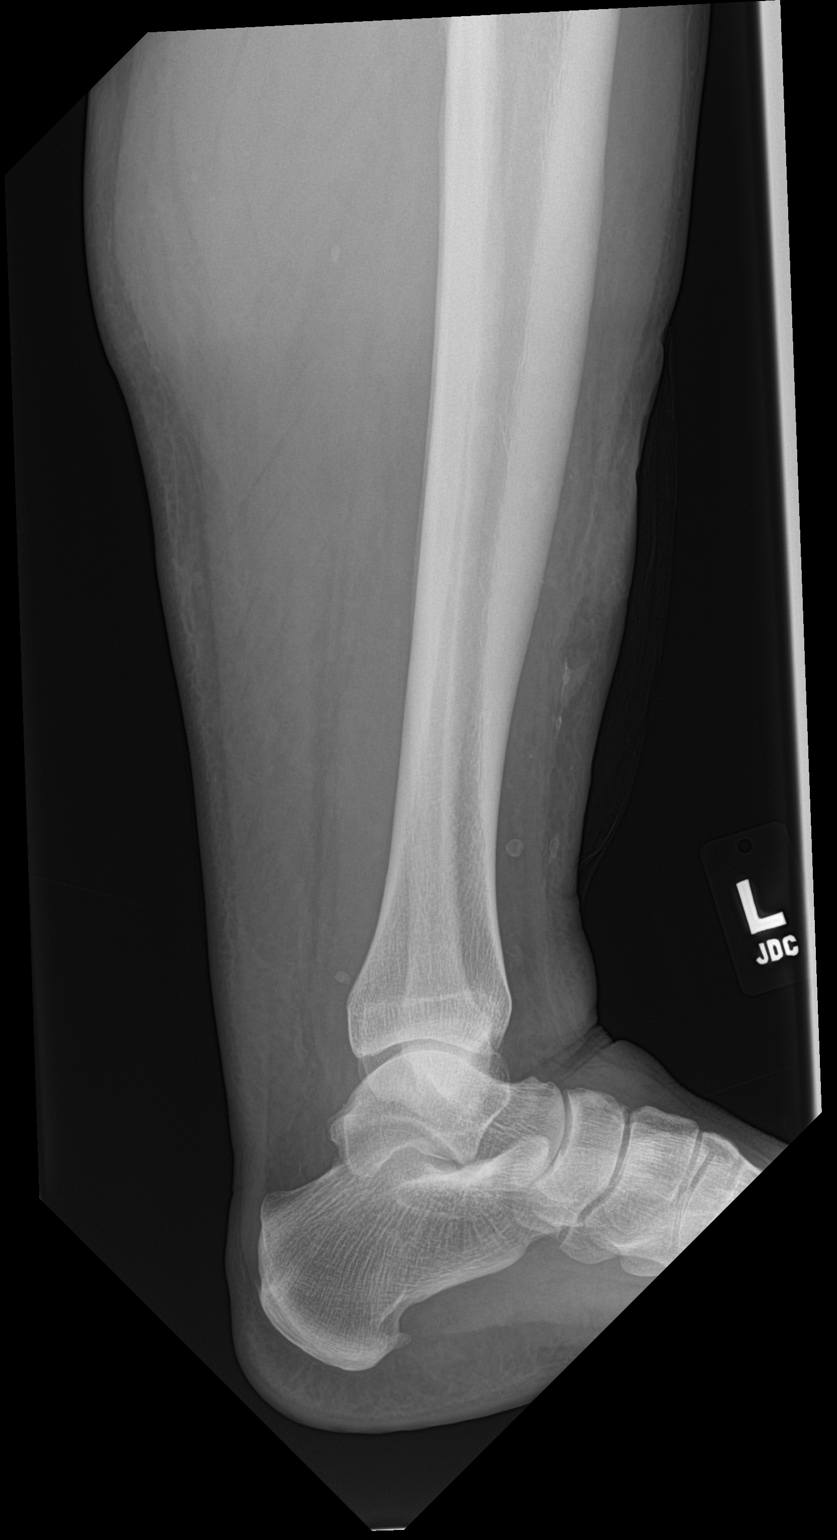

[3 of 3 positions shown; findings below may reference images not displayed]

FINDINGS: There is no evidence of fracture or other focal bone lesions. Mild
to moderate severity soft tissue swelling is seen along the anterior
aspect of the mid to distal left tibial shaft. No soft tissue air is
identified.
IMPRESSION: Mild to moderate severity focal cellulitis along the anterior aspect
of the mid to distal left tibia.
